# Patient Record
Sex: Female | Born: 1980
Health system: Southern US, Community
[De-identification: ages and names within clinical notes are randomized; demographics above are authoritative.]

## PROBLEM LIST (undated history)

## (undated) DIAGNOSIS — R011 Cardiac murmur, unspecified: Secondary | ICD-10-CM

## (undated) DIAGNOSIS — Z973 Presence of spectacles and contact lenses: Secondary | ICD-10-CM

## (undated) DIAGNOSIS — I10 Essential (primary) hypertension: Secondary | ICD-10-CM

## (undated) DIAGNOSIS — R200 Anesthesia of skin: Secondary | ICD-10-CM

## (undated) DIAGNOSIS — Z8669 Personal history of other diseases of the nervous system and sense organs: Secondary | ICD-10-CM

## (undated) DIAGNOSIS — K219 Gastro-esophageal reflux disease without esophagitis: Secondary | ICD-10-CM

## (undated) DIAGNOSIS — F4010 Social phobia, unspecified: Secondary | ICD-10-CM

## (undated) DIAGNOSIS — F909 Attention-deficit hyperactivity disorder, unspecified type: Secondary | ICD-10-CM

## (undated) DIAGNOSIS — E049 Nontoxic goiter, unspecified: Secondary | ICD-10-CM

## (undated) DIAGNOSIS — G43909 Migraine, unspecified, not intractable, without status migrainosus: Secondary | ICD-10-CM

## (undated) DIAGNOSIS — K649 Unspecified hemorrhoids: Secondary | ICD-10-CM

## (undated) DIAGNOSIS — Z8701 Personal history of pneumonia (recurrent): Secondary | ICD-10-CM

## (undated) DIAGNOSIS — E041 Nontoxic single thyroid nodule: Secondary | ICD-10-CM

## (undated) HISTORY — DX: Essential (primary) hypertension: I10

## (undated) HISTORY — PX: WISDOM TOOTH EXTRACTION: SHX21

## (undated) SURGERY — Surgical Case
Anesthesia: *Unknown

---

## 2001-02-26 ENCOUNTER — Other Ambulatory Visit: Admission: RE | Admit: 2001-02-26 | Discharge: 2001-02-26 | Payer: Self-pay | Admitting: Family Medicine

## 2001-07-26 ENCOUNTER — Emergency Department (HOSPITAL_COMMUNITY): Admission: EM | Admit: 2001-07-26 | Discharge: 2001-07-26 | Payer: Self-pay

## 2001-08-09 ENCOUNTER — Ambulatory Visit (HOSPITAL_COMMUNITY): Admission: RE | Admit: 2001-08-09 | Discharge: 2001-08-09 | Payer: Self-pay

## 2002-05-03 ENCOUNTER — Other Ambulatory Visit: Admission: RE | Admit: 2002-05-03 | Discharge: 2002-05-03 | Payer: Self-pay | Admitting: Family Medicine

## 2004-04-18 ENCOUNTER — Other Ambulatory Visit: Admission: RE | Admit: 2004-04-18 | Discharge: 2004-04-18 | Payer: Self-pay | Admitting: Family Medicine

## 2004-08-14 ENCOUNTER — Encounter (INDEPENDENT_AMBULATORY_CARE_PROVIDER_SITE_OTHER): Payer: Self-pay | Admitting: Cardiovascular Disease

## 2004-08-14 ENCOUNTER — Ambulatory Visit: Admission: RE | Admit: 2004-08-14 | Discharge: 2004-08-14 | Payer: Self-pay | Admitting: Family Medicine

## 2008-11-30 HISTORY — PX: BREAST ENHANCEMENT SURGERY: SHX7

## 2010-05-02 ENCOUNTER — Inpatient Hospital Stay (HOSPITAL_COMMUNITY)
Admission: AD | Admit: 2010-05-02 | Discharge: 2010-05-05 | Payer: Self-pay | Source: Home / Self Care | Admitting: Obstetrics and Gynecology

## 2010-05-03 ENCOUNTER — Encounter (INDEPENDENT_AMBULATORY_CARE_PROVIDER_SITE_OTHER): Payer: Self-pay | Admitting: Obstetrics and Gynecology

## 2010-05-11 ENCOUNTER — Inpatient Hospital Stay (HOSPITAL_COMMUNITY): Admission: AD | Admit: 2010-05-11 | Discharge: 2010-05-11 | Payer: Self-pay | Admitting: Obstetrics

## 2010-05-11 ENCOUNTER — Ambulatory Visit: Payer: Self-pay | Admitting: Physician Assistant

## 2010-08-30 ENCOUNTER — Inpatient Hospital Stay (HOSPITAL_COMMUNITY): Admission: AD | Admit: 2010-08-30 | Discharge: 2010-04-27 | Payer: Self-pay | Admitting: Obstetrics and Gynecology

## 2010-12-07 LAB — CBC
HCT: 33.5 % — ABNORMAL LOW (ref 36.0–46.0)
HCT: 40.3 % (ref 36.0–46.0)
HCT: 40.4 % (ref 36.0–46.0)
Hemoglobin: 13.8 g/dL (ref 12.0–15.0)
Hemoglobin: 13.8 g/dL (ref 12.0–15.0)
MCH: 33 pg (ref 26.0–34.0)
MCH: 33.1 pg (ref 26.0–34.0)
MCHC: 34 g/dL (ref 30.0–36.0)
MCHC: 34.1 g/dL (ref 30.0–36.0)
MCHC: 34.1 g/dL (ref 30.0–36.0)
MCV: 96.6 fL (ref 78.0–100.0)
MCV: 96.9 fL (ref 78.0–100.0)
MCV: 98.8 fL (ref 78.0–100.0)
Platelets: 173 10*3/uL (ref 150–400)
Platelets: 223 10*3/uL (ref 150–400)
Platelets: 224 10*3/uL (ref 150–400)
RBC: 4.16 MIL/uL (ref 3.87–5.11)
RBC: 4.18 MIL/uL (ref 3.87–5.11)
RDW: 13.9 % (ref 11.5–15.5)
RDW: 14 % (ref 11.5–15.5)
RDW: 14.5 % (ref 11.5–15.5)
WBC: 10.8 10*3/uL — ABNORMAL HIGH (ref 4.0–10.5)
WBC: 12.2 10*3/uL — ABNORMAL HIGH (ref 4.0–10.5)
WBC: 15.1 10*3/uL — ABNORMAL HIGH (ref 4.0–10.5)

## 2010-12-07 LAB — ABO/RH: ABO/RH(D): O NEG

## 2010-12-07 LAB — COMPREHENSIVE METABOLIC PANEL
AST: 20 U/L (ref 0–37)
Albumin: 3.1 g/dL — ABNORMAL LOW (ref 3.5–5.2)
Alkaline Phosphatase: 136 U/L — ABNORMAL HIGH (ref 39–117)
BUN: 3 mg/dL — ABNORMAL LOW (ref 6–23)
CO2: 23 mEq/L (ref 19–32)
Chloride: 105 mEq/L (ref 96–112)
Creatinine, Ser: 0.74 mg/dL (ref 0.4–1.2)
GFR calc non Af Amer: >60 mL/min (ref >60)
Potassium: 3.6 mEq/L (ref 3.5–5.1)
Total Bilirubin: 0.6 mg/dL (ref 0.3–1.2)

## 2010-12-07 LAB — URINE CULTURE
Colony Count: >=100000
Culture  Setup Time: 201108111537
Special Requests: POSITIVE

## 2010-12-07 LAB — RPR: RPR Ser Ql: NONREACTIVE

## 2010-12-07 LAB — RH IMMUNE GLOB WKUP(>/=20WKS)(NOT WOMEN'S HOSP)

## 2010-12-07 LAB — URIC ACID: Uric Acid, Serum: 5.6 mg/dL (ref 2.4–7.0)

## 2011-01-29 ENCOUNTER — Other Ambulatory Visit: Payer: Self-pay | Admitting: Obstetrics and Gynecology

## 2011-09-24 HISTORY — PX: HEMORRHOID SURGERY: SHX153

## 2012-08-28 ENCOUNTER — Emergency Department: Payer: Self-pay | Admitting: Emergency Medicine

## 2014-01-31 ENCOUNTER — Ambulatory Visit (INDEPENDENT_AMBULATORY_CARE_PROVIDER_SITE_OTHER): Payer: Managed Care, Other (non HMO) | Admitting: Surgery

## 2014-01-31 ENCOUNTER — Encounter (INDEPENDENT_AMBULATORY_CARE_PROVIDER_SITE_OTHER): Payer: Self-pay | Admitting: Surgery

## 2014-01-31 VITALS — BP 120/80 | HR 80 | Temp 97.2°F | Resp 12 | Ht 65.0 in | Wt 154.4 lb

## 2014-01-31 DIAGNOSIS — K5900 Constipation, unspecified: Secondary | ICD-10-CM

## 2014-01-31 DIAGNOSIS — K5909 Other constipation: Secondary | ICD-10-CM

## 2014-01-31 DIAGNOSIS — K644 Residual hemorrhoidal skin tags: Secondary | ICD-10-CM

## 2014-01-31 DIAGNOSIS — K648 Other hemorrhoids: Secondary | ICD-10-CM

## 2014-01-31 NOTE — Progress Notes (Signed)
Subjective:     Patient ID: Evelyn Lawson, female   DOB: 17-Mar-1981, 33 y.o.   MRN: [id number]  HPI  Note: This dictation was prepared with Dragon/digital dictation along with [facility] technology. Any transcriptional errors that result from this process are unintentional.       Evelyn Lawson  [birth year] [id number]  Patient Care Team: Lucianne Lei, MD as PCP - General (Family Medicine) Marvene Staff, MD as Consulting Physician (Obstetrics and Gynecology)  This patient is a 33 y.o.female who presents today for surgical evaluation at the request of Dr. Garwin Brothers.   Reason for visit: External hemorrhoids with irritation and pain.  History of internal hemorrhoids.  Pleasant young female.  Times a day with herself.  Started having more problems when she was pregnant a few years ago.  Had issues with bleeding and pain.  That has calmed down.  However she noticed hemorrhoids on the outside.  It is hard to keep the skin clean.  They can be irritating.  She struggles with constipation with a bowel movement usually twice a week.  No persistent bleeding or prolapse.  No pain with defecation  No personal nor family history of GI/colon cancer, inflammatory bowel disease, irritable bowel syndrome, allergy such as Celiac Sprue, dietary/dairy problems, colitis, ulcers nor gastritis.  No recent sick contacts/gastroenteritis.  No travel outside the country.  No changes in diet.  No dysphagia to solids or liquids.  No significant heartburn or reflux.  No hematochezia, hematemesis, coffee ground emesis.  No evidence of prior gastric/peptic ulceration.    There are no active problems to display for this patient.   Past Medical History  Diagnosis Date  . Hypertension     Past Surgical History  Procedure Laterality Date  . Breast enhancement surgery  11/30/2008    History   Social History  . Marital Status: Single    Spouse Name: N/A    Number of Children: N/A  . Years of Education: N/A    Occupational History  . Not on file.   Social History Main Topics  . Smoking status: Never Smoker   . Smokeless tobacco: Never Used  . Alcohol Use: 1.2 oz/week    1 Glasses of wine, 1 Cans of beer per week     Comment: occ  . Drug Use: No  . Sexual Activity: Not on file   Other Topics Concern  . Not on file   Social History Narrative  . No narrative on file    Family History  Problem Relation Age of Onset  . Cancer Maternal Grandmother     breast  . Cancer Paternal Grandmother     breast  . Cancer Paternal Grandfather     colon  . Diabetes Maternal Grandfather     Current Outpatient Prescriptions  Medication Sig Dispense Refill  . nebivolol (BYSTOLIC) 5 MG tablet Take 5 mg by mouth daily.       No current facility-administered medications for this visit.     No Known Allergies  BP 120/80  Pulse 80  Temp(Src) 97.2 F (36.2 C)  Resp 12  Ht 5\' 5"  (1.651 m)  Wt 154 lb 6.4 oz (70.035 kg)  BMI 25.69 kg/m2  No results found.   Review of Systems  Constitutional: Negative for fever, chills, diaphoresis, appetite change and fatigue.  HENT: Negative for ear discharge, ear pain, sore throat and trouble swallowing.   Eyes: Negative for photophobia, discharge and visual disturbance.  Respiratory: Negative for cough, choking,  chest tightness and shortness of breath.   Cardiovascular: Negative for chest pain and palpitations.  Gastrointestinal: Positive for anal bleeding and rectal pain. Negative for nausea, vomiting, abdominal pain, diarrhea and constipation.  Endocrine: Negative for cold intolerance and heat intolerance.  Genitourinary: Negative for dysuria, frequency and difficulty urinating.  Musculoskeletal: Negative for gait problem, myalgias and neck pain.  Skin: Negative for color change, pallor and rash.  Allergic/Immunologic: Negative for environmental allergies, food allergies and immunocompromised state.  Neurological: Negative for dizziness, speech  difficulty, weakness and numbness.  Hematological: Negative for adenopathy.  Psychiatric/Behavioral: Negative for confusion and agitation. The patient is not nervous/anxious.        Objective:   Physical Exam  Constitutional: She is oriented to person, place, and time. She appears well-developed and well-nourished. No distress.  HENT:  Head: Normocephalic.  Mouth/Throat: Oropharynx is clear and moist. No oropharyngeal exudate.  Eyes: Conjunctivae and EOM are normal. Pupils are equal, round, and reactive to light. No scleral icterus.  Neck: Normal range of motion. Neck supple. No tracheal deviation present.  Cardiovascular: Normal rate, regular rhythm and intact distal pulses.   Pulmonary/Chest: Effort normal and breath sounds normal. No stridor. No respiratory distress. She exhibits no tenderness.  Abdominal: Soft. She exhibits no distension and no mass. There is no tenderness. Hernia confirmed negative in the right inguinal area and confirmed negative in the left inguinal area.  Genitourinary: Rectal exam shows external hemorrhoid. Rectal exam shows no fissure and anal tone normal.    No vaginal discharge found.  Exam done with assistance of female Medical Assistant in the room.  Perianal skin clean with good hygiene.  No pruritis.  No pilonidal disease.  No fissure.  No abscess/fistula.  Normal sphincter tone.  Tolerates digital and anoscopic rectal exam.  No rectal masses.  External hemorrhoids R post>Rant..    Hemorrhoidal piles internally mildly enlarged  Musculoskeletal: Normal range of motion. She exhibits no tenderness.       Right elbow: She exhibits normal range of motion.       Left elbow: She exhibits normal range of motion.       Right wrist: She exhibits normal range of motion.       Left wrist: She exhibits normal range of motion.       Right hand: Normal strength noted.       Left hand: Normal strength noted.  Lymphadenopathy:       Head (right side): No posterior  auricular adenopathy present.       Head (left side): No posterior auricular adenopathy present.    She has no cervical adenopathy.    She has no axillary adenopathy.       Right: No inguinal adenopathy present.       Left: No inguinal adenopathy present.  Neurological: She is alert and oriented to person, place, and time. No cranial nerve deficit. She exhibits normal muscle tone. Coordination normal.  Skin: Skin is warm and dry. No rash noted. She is not diaphoretic. No erythema.  Psychiatric: She has a normal mood and affect. Her behavior is normal. Judgment and thought content normal.       Assessment:     External hemorrhoids with irritation and occasional pain.  Internal hemorrhoids.  Chronic constipation.     Plan:     I think she would benefit from surgery to remove the external hemorrhoids.  Too large & sensitive to do in office.  This allowed me to see if she  may need internal hemorrhoid ligation or removal as well.  Not to point of needing [place].  She is interested in proceeding:  The anatomy & physiology of the anorectal region was discussed.  The pathophysiology of hemorrhoids and differential diagnosis was discussed.  Natural history risks without surgery was discussed.   I stressed the importance of a bowel regimen to have daily soft bowel movements to minimize progression of disease.  Interventions such as sclerotherapy & banding were discussed.  The patient's symptoms are not adequately controlled by medicines and other non-operative treatments.  I feel the risks & problems of no surgery outweigh the operative risks; therefore, I recommended surgery to treat the hemorrhoids by ligation, pexy, and possible resection.  Risks such as bleeding, infection, urinary difficulties, need for further treatment, heart attack, death, and other risks were discussed.   I noted a good likelihood this will help address the problem.  Goals of post-operative recovery were discussed as well.   Possibility that this will not correct all symptoms was explained.  Post-operative pain, bleeding, constipation, and other problems after surgery were discussed.  We will work to minimize complications.   Educational handouts further explaining the pathology, treatment options, and bowel regimen were given as well.  Questions were answered.  The patient expresses understanding & wishes to proceed with surgery.

## 2014-01-31 NOTE — Patient Instructions (Signed)
Please consider the recommendations that we have given you today:  Consider surgical examination under anesthesia with removal of the external and possible internal hemorrhoids  See the Handout(s) we have given you.  Please call our office at [phone] if you wish to schedule surgery or if you have further questions / concerns.   HEMORRHOIDS  The rectum is the last foot of your colon, and it naturally stretches to hold stool.  Hemorrhoidal piles are natural clusters of blood vessels that help the rectum and anal canal stretch to hold stool and allow bowel movements to eliminate feces.   Hemorrhoids are abnormally swollen blood vessels in the rectum.  Too much pressure in the rectum causes hemorrhoids by forcing blood to stretch and bulge the walls of the veins, sometimes even rupturing them.  Hemorrhoids can become like varicose veins you might see on a person's legs.  Most people will develop a flare of hemorrhoids in their lifetime.  When bulging hemorrhoidal veins are irritated, they can swell, burn, itch, cause pain, and bleed.  Most flares will calm down gradually own within a few weeks.  However, once hemorrhoids are created, they are difficult to get rid of completely and tend to flare more easily than the first flare.   Fortunately, good habits and simple medical treatment usually control hemorrhoids well, and surgery is needed only in severe cases. Types of Hemorrhoids:  Internal hemorrhoids usually don't initially hurt or itch; they are deep inside the rectum and usually have no sensation. If they begin to push out (prolapse), pain and burning can occur.  However, internal hemorrhoids can bleed.  Anal bleeding should not be ignored since bleeding could come from a dangerous source like colorectal cancer, so persistent rectal bleeding should be investigated by a doctor, sometimes with a colonoscopy.  External hemorrhoids cause most of the symptoms - pain, burning, and itching.  Nonirritated hemorrhoids can look like small skin tags coming out of the anus.   Thrombosed hemorrhoids can form when a hemorrhoid blood vessel bursts and causes the hemorrhoid to suddenly swell.  A purple blood clot can form in it and become an excruciatingly painful lump at the anus. Because of these unpleasant symptoms, immediate incision and drainage by a surgeon at an office visit can provide much relief of the pain.    PREVENTION Avoiding the most frequent causes listed below will prevent most cases of hemorrhoids: Constipation Hard stools Diarrhea  Constant sitting  Straining with bowel movements Sitting on the toilet for a long time  Severe coughing  episodes Pregnancy / Childbirth  Heavy Lifting  Sometimes avoiding the above triggers is difficult:  How can you avoid sitting all day if you have a seated job? Also, we try to avoid coughing and diarrhea, but sometimes it's beyond your control.  Still, there are some practical hints to help: Keep the anal and genital area clean.  Moistened tissues such as flushable wet wipes are less irritating than toilet paper.  Using irrigating showers or bottle irrigation washing gently cleans this sensitive area.   Avoid dry toilet paper when cleaning after bowel movements.  Marland Kitchen Keep the anal and genital area dry.  Lightly pat the rectal area dry.  Avoid rubbing.  Talcum or baby powders can help GET YOUR STOOLS SOFT.   This is the most important way to prevent irritated hemorrhoids.  Hard stools are like sandpaper to the anorectal canal and will cause more problems.  The goal: ONE SOFT BOWEL MOVEMENT A  DAY!  BMs from every other day to 3 times a day is a tolerable range Treat coughing, diarrhea and constipation early since irritated hemorrhoids may soon follow.  If your main job activity is seated, always stand or walk during your breaks. Make it a point to stand and walk at least 5 minutes every hour and try to shift frequently in your chair to avoid  direct rectal pressure.  Always exhale as you strain or lift. Don't hold your breath.  Do not delay or try to prevent a bowel movement when the urge is present. Exercise regularly (walking or jogging 60 minutes a day) to stimulate the bowels to move. No reading or other activity while on the toilet. If bowel movements take longer than 5 minutes, you are too constipated. AVOID CONSTIPATION Drink plenty of liquids (1 1/2 to 2 quarts of water and other fluids a day unless fluid restricted for another medical condition). Liquids that contain caffeine (coffee a, tea, soft drinks) can be dehydrating and should be avoided until constipation is controlled. Consider minimizing milk, as dairy products may be constipating. Eat plenty of fiber (30g a day ideal, more if needed).  Fiber is the undigested part of plant food that passes into the colon, acting as "natures broom" to encourage bowel motility and movement.  Fiber can absorb and hold large amounts of water. This results in a larger, bulkier stool, which is soft and easier to pass.  Eating foods high in fiber - 12 servings - such as  Vegetables: Root (potatoes, carrots, turnips), Leafy green (lettuce, salad greens, celery, spinach), High residue (cabbage, broccoli, etc.) Fruit: Fresh, Dried (prunes, apricots, cherries), Stewed (applesauce)  Whole grain breads, pasta, whole wheat Bran cereals, muffins, etc. Consider adding supplemental bulking fiber which retains large volumes of water: Psyllium ground seeds (native plant from [place])--available as Metamucil, Konsyl, Effersyllium, Per Diem Fiber, or the less expensive generic forms.  Citrucel  (methylcellulose wood fiber) . FiberCon (Polycarbophil) Polyethylene Glycol - and "artificial" fiber commonly called Miralax or Glycolax.  It is helpful for people with gassy or bloated feelings with regular fiber Flax Seed - a less gassy natural fiber  Laxatives can be useful for a short period if  constipation is severe Osmotics (Milk of Magnesia, Fleets Phospho-Soda, Magnesium Citrate)  Stimulants (Senokot,   Castor Oil,  Dulcolax, Ex-Lax)    Laxatives are not a good long-term solution as it can stress the bowels and cause too much mineral loss and dehydration.   Avoid taking laxatives for more than 7 days in a row.  AVOID DIARRHEA Switch to liquids and simpler foods for a few days to avoid stressing your intestines further. Avoid dairy products (especially milk & ice cream) for a short time.  The intestines often can lose the ability to digest lactose when stressed. Avoid foods that cause gassiness or bloating.  Typical foods include beans and other legumes, cabbage, broccoli, and dairy foods.  Every person has some sensitivity to other foods, so listen to your body and avoid those foods that trigger problems for you. Adding fiber (Citrucel, Metamucil, FiberCon, Flax seed, Miralax) gradually can help thicken stools by absorbing excess fluid and retrain the intestines to act more normally.  Slowly increase the dose over a few weeks.  Too much fiber too soon can backfire and cause cramping & bloating. Probiotics (such as active yogurt, Align, etc) may help repopulate the intestines and colon with normal bacteria and calm down a sensitive digestive tract.  Most  studies show it to be of mild help, though, and such products can be costly. Medicines: Bismuth subsalicylate (ex. Kayopectate, Pepto Bismol) every 30 minutes for up to 6 doses can help control diarrhea.  Avoid if pregnant. Loperamide (Immodium) can slow down diarrhea.  Start with two tablets (4mg  total) first and then try one tablet every 6 hours.  Avoid if you are having fevers or severe pain.  If you are not better or start feeling worse, stop all medicines and call your doctor for advice Call your doctor if you are getting worse or not better.  Sometimes further testing (cultures, endoscopy, X-ray studies, bloodwork, etc) may be needed  to help diagnose and treat the cause of the diarrhea. TREATMENT OF HEMORRHOID FLARE If these preventive measures fail, you must take action right away! Hemorrhoids are one condition that can be mild in the morning and become intolerable by nightfall. Most hemorrhoidal flares take several weeks to calm down.  These suggestions can help: Warm soaks.  This helps more than any topical medication.  Use up to 8 times a day.  Usually sitz baths or sitting in a warm bathtub helps.  Sitting on moist warm towels are helpful.  Switching to ice packs/cool compresses can be helpful  Use a Sitz Bath 4-8 times a day for relief A sitz bath is a warm water bath taken in the sitting position that covers only the hips and buttocks. It may be used for either healing or hygiene purposes. Sitz baths are also used to relieve pain, itching, or muscle spasms. The water may contain medicine. Moist heat will help you heal and relax.  HOME CARE INSTRUCTIONS  Take 3 to 4 sitz baths a day. 1. Fill the bathtub half full with warm water. 2. Sit in the water and open the drain a little. 3. Turn on the warm water to keep the tub half full. Keep the water running constantly. 4. Soak in the water for 15 to 20 minutes. 5. After the sitz bath, pat the affected area dry first. SEEK MEDICAL CARE IF:  You get worse instead of better. Stop the sitz baths if you get worse.  Normalize your bowels.  Extremes of diarrhea or constipation will make hemorrhoids worse.  One soft bowel movement a day is the goal.  Fiber can help get your bowels regular Wet wipes instead of toilet paper Pain control with a NSAID such as ibuprofen (Advil) or naproxen (Aleve) or acetaminophen (Tylenol) around the clock.  Narcotics are constipating and should be minimized if possible Topical creams contain steroids (bydrocortisone) or local anesthetic (xylocaine) can help make pain and itching more tolerable.   EVALUATION If hemorrhoids are still causing problems,  you could benefit by an evaluation by a surgeon.  The surgeon will obtain a history and examine you.  If hemorrhoids are diagnosed, some therapies can be offered in the office, usually with an anoscope into the less sensitive area of the rectum: -injection of hemorrhoids (sclerotherapy) can scar the blood vessels of the swollen/enlarged hemorrhoids to help shrink them down to a more normal size -rubber banding of the enlarged hemorrhoids to help shrink them down to a more normal size -drainage of the blood clot causing a thrombosed hemorrhoid,  to relieve the severe pain   While 90% of the time such problems from hemorrhoids can be managed without preceding to surgery, sometimes the hemorrhoids require a operation to control the problem (uncontrolled bleeding, prolapse, pain, etc.).   This involves being  placed under general anesthesia where the surgeon can confirm the diagnosis and remove, suture, or staple the hemorrhoid(s).  Your surgeon can help you treat the problem appropriately.    ANORECTAL SURGERY: POST OP INSTRUCTIONS  1. Take your usually prescribed home medications unless otherwise directed. 2. DIET: Follow a light bland diet the first 24 hours after arrival home, such as soup, liquids, crackers, etc.  Be sure to include lots of fluids daily.  Avoid fast food or heavy meals as your are more likely to get nauseated.  Eat a low fat the next few days after surgery.   3. PAIN CONTROL: a. Pain is best controlled by a usual combination of three different methods TOGETHER: i. Ice/Heat ii. Over the counter pain medication iii. Prescription pain medication b. Most patients will experience some swelling and discomfort in the anus/rectal area. and incisions.  Ice packs or heat (30-60 minutes up to 6 times a day) will help. Use ice for the first few days to help decrease swelling and bruising, then switch to heat such as warm towels, sitz baths, warm baths, etc to help relax tight/sore spots and  speed recovery.  Some people prefer to use ice alone, heat alone, alternating between ice & heat.  Experiment to what works for you.  Swelling and bruising can take several weeks to resolve.   c. It is helpful to take an over-the-counter pain medication regularly for the first few weeks.  Choose one of the following that works best for you: i. Naproxen (Aleve, etc)  Two 220mg  tabs twice a day ii. Ibuprofen (Advil, etc) Three 200mg  tabs four times a day (every meal & bedtime) iii. Acetaminophen (Tylenol, etc) 500-650mg  four times a day (every meal & bedtime) d. A  prescription for pain medication (such as oxycodone, hydrocodone, etc) should be given to you upon discharge.  Take your pain medication as prescribed.  i. If you are having problems/concerns with the prescription medicine (does not control pain, nausea, vomiting, rash, itching, etc), please call us [phone] to see if we need to switch you to a different pain medicine that will work better for you and/or control your side effect better. ii. If you need a refill on your pain medication, please contact your pharmacy.  They will contact our office to request authorization. Prescriptions will not be filled after 5 pm or on week-ends.  Use a Sitz Bath 4-8 times a day for relief A sitz bath is a warm water bath taken in the sitting position that covers only the hips and buttocks. It may be used for either healing or hygiene purposes. Sitz baths are also used to relieve pain, itching, or muscle spasms. The water may contain medicine. Moist heat will help you heal and relax.  HOME CARE INSTRUCTIONS  Take 3 to 4 sitz baths a day. 6. Fill the bathtub half full with warm water. 7. Sit in the water and open the drain a little. 8. Turn on the warm water to keep the tub half full. Keep the water running constantly. 9. Soak in the water for 15 to 20 minutes. 10. After the sitz bath, pat the affected area dry first. SEEK MEDICAL CARE IF:  You get  worse instead of better. Stop the sitz baths if you get worse.   4. KEEP YOUR BOWELS REGULAR a. The goal is one bowel movement a day b. Avoid getting constipated.  Between the surgery and the pain medications, it is common to experience some constipation.  Increasing fluid intake and taking a fiber supplement (such as Metamucil, Citrucel, FiberCon, MiraLax, etc) 1-2 times a day regularly will usually help prevent this problem from occurring.  A mild laxative (prune juice, Milk of Magnesia, MiraLax, etc) should be taken according to package directions if there are no bowel movements after 48 hours. c. Watch out for diarrhea.  If you have many loose bowel movements, simplify your diet to bland foods & liquids for a few days.  Stop any stool softeners and decrease your fiber supplement.  Switching to mild anti-diarrheal medications (Kayopectate, Pepto Bismol) can help.  If this worsens or does not improve, please call us.  5. Wound Care a. Remove your bandages the day after surgery.  Unless discharge instructions indicate otherwise, leave your bandage dry and in place overnight.  Remove the bandage during your first bowel movement.   b. Allow the wound packing to fall out over the next few days.  You can trim exposed gauze / ribbon as it falls out.  You do not need to repack the wound unless instructed otherwise.  Wear an absorbent pad or soft cotton gauze in your underwear as needed to catch any drainage and help keep the area  c. Keep the area clean and dry.  Bathe / shower every day.  Keep the area clean by showering / bathing over the incision / wound.   It is okay to soak an open wound to help wash it.  Wet wipes or showers / gentle washing after bowel movements is often less traumatic than regular toilet paper. d. Dennis Bast may have some styrofoam-like soft packing in the rectum which will come out with the first bowel movement.  e. You will often notice bleeding with bowel movements.  This should slow  down by the end of the first week of surgery f. Expect some drainage.  This should slow down, too, by the end of the first week of surgery.  Wear an absorbent pad or soft cotton gauze in your underwear until the drainage stops. 6. ACTIVITIES as tolerated:   a. You may resume regular (light) daily activities beginning the next day-such as daily self-care, walking, climbing stairs-gradually increasing activities as tolerated.  If you can walk 30 minutes without difficulty, it is safe to try more intense activity such as jogging, treadmill, bicycling, low-impact aerobics, swimming, etc. b. Save the most intensive and strenuous activity for last such as sit-ups, heavy lifting, contact sports, etc  Refrain from any heavy lifting or straining until you are off narcotics for pain control.   c. DO NOT PUSH THROUGH PAIN.  Let pain be your guide: If it hurts to do something, don't do it.  Pain is your body warning you to avoid that activity for another week until the pain goes down. d. You may drive when you are no longer taking prescription pain medication, you can comfortably sit for long periods of time, and you can safely maneuver your car and apply brakes. e. Dennis Bast may have sexual intercourse when it is comfortable.  7. FOLLOW UP in our office a. Please call [facility] at [phone] to set up an appointment to see your surgeon in the office for a follow-up appointment approximately 2 weeks after your surgery. b. Make sure that you call for this appointment the day you arrive home to insure a convenient appointment time. 10. IF YOU HAVE DISABILITY OR FAMILY LEAVE FORMS, BRING THEM TO THE OFFICE FOR PROCESSING.  DO NOT GIVE THEM TO  YOUR DOCTOR.        WHEN TO CALL us [phone]: 1. Poor pain control 2. Reactions / problems with new medications (rash/itching, nausea, etc)  3. Fever over 101.5 F (38.5 C) 4. Inability to urinate 5. Nausea and/or vomiting 6. Worsening swelling or bruising 7. Continued  bleeding from incision. 8. Increased pain, redness, or drainage from the incision  The clinic staff is available to answer your questions during regular business hours (8:30am-5pm).  Please don't hesitate to call and ask to speak to one of our nurses for clinical concerns.   A surgeon from [facility] is always on call at the hospitals   If you have a medical emergency, go to the nearest emergency room or call 911.    [facility], [address], [place], [place], [place]  [address] ? MAIN: [phone] ? TOLL FREE: [phone] ? FAX [phone] [email or web address]   GETTING TO GOOD BOWEL HEALTH. Irregular bowel habits such as constipation and diarrhea can lead to many problems over time.  Having one soft bowel movement a day is the most important way to prevent further problems.  The anorectal canal is designed to handle stretching and feces to safely manage our ability to get rid of solid waste (feces, poop, stool) out of our body.  BUT, hard constipated stools can act like ripping concrete bricks and diarrhea can be a burning fire to this very sensitive area of our body, causing inflamed hemorrhoids, anal fissures, increasing risk is perirectal abscesses, abdominal pain/bloating, an making irritable bowel worse.     The goal: ONE SOFT BOWEL MOVEMENT A DAY!  To have soft, regular bowel movements:    Drink at least 8 tall glasses of water a day.     Take plenty of fiber.  Fiber is the undigested part of plant food that passes into the colon, acting s "natures broom" to encourage bowel motility and movement.  Fiber can absorb and hold large amounts of water. This results in a larger, bulkier stool, which is soft and easier to pass. Work gradually over several weeks up to 6 servings a day of fiber (25g a day even more if needed) in the form of: o Vegetables -- Root (potatoes, carrots, turnips), leafy green (lettuce, salad greens, celery,  spinach), or cooked high residue (cabbage, broccoli, etc) o Fruit -- Fresh (unpeeled skin & pulp), Dried (prunes, apricots, cherries, etc ),  or stewed ( applesauce)  o Whole grain breads, pasta, etc (whole wheat)  o Bran cereals    Bulking Agents -- This type of water-retaining fiber generally is easily obtained each day by one of the following:  o Psyllium bran -- The psyllium plant is remarkable because its ground seeds can retain so much water. This product is available as Metamucil, Konsyl, Effersyllium, Per Diem Fiber, or the less expensive generic preparation in drug and health food stores. Although labeled a laxative, it really is not a laxative.  o Methylcellulose -- This is another fiber derived from wood which also retains water. It is available as Citrucel. o Polyethylene Glycol - and "artificial" fiber commonly called Miralax or Glycolax.  It is helpful for people with gassy or bloated feelings with regular fiber o Flax Seed - a less gassy fiber than psyllium   No reading or other relaxing activity while on the toilet. If bowel movements take longer than 5 minutes, you are too constipated   AVOID CONSTIPATION.  High fiber and water  intake usually takes care of this.  Sometimes a laxative is needed to stimulate more frequent bowel movements, but    Laxatives are not a good long-term solution as it can wear the colon out. o Osmotics (Milk of Magnesia, Fleets phosphosoda, Magnesium citrate, MiraLax, GoLytely) are safer than  o Stimulants (Senokot, Castor Oil, Dulcolax, Ex Lax)    o Do not take laxatives for more than 7days in a row.    IF SEVERELY CONSTIPATED, try a Bowel Retraining Program: o Do not use laxatives.  o Eat a diet high in roughage, such as bran cereals and leafy vegetables.  o Drink six (6) ounces of prune or apricot juice each morning.  o Eat two (2) large servings of stewed fruit each day.  o Take one (1) heaping tablespoon of a psyllium-based bulking agent twice a day.  Use sugar-free sweetener when possible to avoid excessive calories.  o Eat a normal breakfast.  o Set aside 15 minutes after breakfast to sit on the toilet, but do not strain to have a bowel movement.  o If you do not have a bowel movement by the third day, use an enema and repeat the above steps.    Controlling diarrhea o Switch to liquids and simpler foods for a few days to avoid stressing your intestines further. o Avoid dairy products (especially milk & ice cream) for a short time.  The intestines often can lose the ability to digest lactose when stressed. o Avoid foods that cause gassiness or bloating.  Typical foods include beans and other legumes, cabbage, broccoli, and dairy foods.  Every person has some sensitivity to other foods, so listen to our body and avoid those foods that trigger problems for you. o Adding fiber (Citrucel, Metamucil, psyllium, Miralax) gradually can help thicken stools by absorbing excess fluid and retrain the intestines to act more normally.  Slowly increase the dose over a few weeks.  Too much fiber too soon can backfire and cause cramping & bloating. o Probiotics (such as active yogurt, Align, etc) may help repopulate the intestines and colon with normal bacteria and calm down a sensitive digestive tract.  Most studies show it to be of mild help, though, and such products can be costly. o Medicines:   Bismuth subsalicylate (ex. Kayopectate, Pepto Bismol) every 30 minutes for up to 6 doses can help control diarrhea.  Avoid if pregnant.   Loperamide (Immodium) can slow down diarrhea.  Start with two tablets (4mg  total) first and then try one tablet every 6 hours.  Avoid if you are having fevers or severe pain.  If you are not better or start feeling worse, stop all medicines and call your doctor for advice o Call your doctor if you are getting worse or not better.  Sometimes further testing (cultures, endoscopy, X-ray studies, bloodwork, etc) may be needed to help  diagnose and treat the cause of the diarrhea. o

## 2014-07-21 ENCOUNTER — Other Ambulatory Visit (INDEPENDENT_AMBULATORY_CARE_PROVIDER_SITE_OTHER): Payer: Self-pay | Admitting: Surgery

## 2015-12-18 ENCOUNTER — Emergency Department (HOSPITAL_COMMUNITY): Payer: Managed Care, Other (non HMO)

## 2015-12-18 ENCOUNTER — Encounter (HOSPITAL_COMMUNITY): Payer: Self-pay

## 2015-12-18 ENCOUNTER — Emergency Department (HOSPITAL_COMMUNITY)
Admission: EM | Admit: 2015-12-18 | Discharge: 2015-12-18 | Disposition: A | Discharge status: Home / Self Care | Payer: Managed Care, Other (non HMO) | Attending: Emergency Medicine | Admitting: Emergency Medicine

## 2015-12-18 DIAGNOSIS — Z8659 Personal history of other mental and behavioral disorders: Secondary | ICD-10-CM | POA: Diagnosis not present

## 2015-12-18 DIAGNOSIS — Z3202 Encounter for pregnancy test, result negative: Secondary | ICD-10-CM | POA: Diagnosis not present

## 2015-12-18 DIAGNOSIS — I10 Essential (primary) hypertension: Secondary | ICD-10-CM | POA: Insufficient documentation

## 2015-12-18 DIAGNOSIS — B349 Viral infection, unspecified: Secondary | ICD-10-CM | POA: Insufficient documentation

## 2015-12-18 DIAGNOSIS — Z79899 Other long term (current) drug therapy: Secondary | ICD-10-CM | POA: Insufficient documentation

## 2015-12-18 DIAGNOSIS — R55 Syncope and collapse: Secondary | ICD-10-CM | POA: Diagnosis present

## 2015-12-18 DIAGNOSIS — R079 Chest pain, unspecified: Secondary | ICD-10-CM | POA: Insufficient documentation

## 2015-12-18 HISTORY — DX: Attention-deficit hyperactivity disorder, unspecified type: F90.9

## 2015-12-18 HISTORY — DX: Social phobia, unspecified: F40.10

## 2015-12-18 LAB — URINE MICROSCOPIC-ADD ON

## 2015-12-18 LAB — BASIC METABOLIC PANEL
ANION GAP: 9 (ref 5–15)
BUN: 8 mg/dL (ref 6–20)
CHLORIDE: 99 mmol/L — AB (ref 101–111)
CO2: 29 mmol/L (ref 22–32)
Calcium: 8.8 mg/dL — ABNORMAL LOW (ref 8.9–10.3)
Creatinine, Ser: 0.97 mg/dL (ref 0.44–1.00)
GFR calc non Af Amer: >60 mL/min (ref >60)
GLUCOSE: 100 mg/dL — AB (ref 65–99)
Potassium: 3.5 mmol/L (ref 3.5–5.1)
Sodium: 137 mmol/L (ref 135–145)

## 2015-12-18 LAB — CBC
HEMATOCRIT: 39.9 % (ref 36.0–46.0)
HEMOGLOBIN: 13.2 g/dL (ref 12.0–15.0)
MCH: 31.4 pg (ref 26.0–34.0)
MCHC: 33.1 g/dL (ref 30.0–36.0)
MCV: 94.8 fL (ref 78.0–100.0)
Platelets: 159 10*3/uL (ref 150–400)
RBC: 4.21 MIL/uL (ref 3.87–5.11)
RDW: 12.7 % (ref 11.5–15.5)
WBC: 4.5 10*3/uL (ref 4.0–10.5)

## 2015-12-18 LAB — I-STAT BETA HCG BLOOD, ED (MC, WL, AP ONLY)

## 2015-12-18 LAB — URINALYSIS, ROUTINE W REFLEX MICROSCOPIC
BILIRUBIN URINE: NEGATIVE
GLUCOSE, UA: NEGATIVE mg/dL
Ketones, ur: NEGATIVE mg/dL
Leukocytes, UA: NEGATIVE
Nitrite: NEGATIVE
PH: 7 (ref 5.0–8.0)
Protein, ur: NEGATIVE mg/dL
SPECIFIC GRAVITY, URINE: 1.01 (ref 1.005–1.030)

## 2015-12-18 LAB — HEPATIC FUNCTION PANEL
ALT: 17 U/L (ref 14–54)
AST: 24 U/L (ref 15–41)
Albumin: 4.4 g/dL (ref 3.5–5.0)
Alkaline Phosphatase: 39 U/L (ref 38–126)
Total Bilirubin: 0.5 mg/dL (ref 0.3–1.2)
Total Protein: 7.8 g/dL (ref 6.5–8.1)

## 2015-12-18 LAB — CBG MONITORING, ED: Glucose-Capillary: 100 mg/dL — ABNORMAL HIGH (ref 65–99)

## 2015-12-18 MED ORDER — SODIUM CHLORIDE 0.9 % IV BOLUS (SEPSIS)
1000.0000 mL | Freq: Once | INTRAVENOUS | Status: AC
  Administered 2015-12-18: 1000 mL via INTRAVENOUS

## 2015-12-18 MED ORDER — ONDANSETRON 4 MG PO TBDP
4.0000 mg | ORAL_TABLET | Freq: Once | ORAL | Status: AC | PRN
  Administered 2015-12-18: 4 mg via ORAL
  Filled 2015-12-18: qty 1

## 2015-12-18 NOTE — ED Notes (Signed)
Bed: WA19 Expected date:  Expected time:  Means of arrival:  Comments: 

## 2015-12-18 NOTE — Discharge Instructions (Signed)
Plenty of fluids take Tylenol for fever and rest for 2 days

## 2015-12-18 NOTE — ED Notes (Signed)
Pt c/o several episodes of syncope, cough, nasal congestion, nausea, and L ribcage pain x 2 days.  Pain score 4/10.  Pt reports taking OTC medications w/o relief.  Pt sts her daughter was recently diagnosed w/ the flu.  Sts she has been trying to stay hydrated.

## 2015-12-19 NOTE — ED Provider Notes (Signed)
CSN: [id number]     Arrival date & time 12/18/15  1011 History   First MD Initiated Contact with Patient 12/18/15 1113     Chief Complaint  Patient presents with  . Loss of Consciousness  . Cough  . Chest Pain     (Consider location/radiation/quality/duration/timing/severity/associated sxs/prior Treatment) Patient is a 35 y.o. female presenting with syncope, cough, and chest pain. The history is provided by the patient (Patient states that she's had a cough weakness for couple days and has had 2 near syncopal episodes where she gets dizzy and sits down but does not blackout. Her daughter has the flu).  Loss of Consciousness Episode history: 2 episodes but not complete loss of consciousness. Most recent episode:  Today Timing:  Intermittent Progression:  Waxing and waning Chronicity:  New Context: not blood draw   Associated symptoms: chest pain and fever   Associated symptoms: no headaches and no seizures   Cough Associated symptoms: chest pain and fever   Associated symptoms: no eye discharge, no headaches and no rash   Chest Pain Associated symptoms: cough, fatigue, fever and syncope   Associated symptoms: no abdominal pain, no back pain and no headache     Past Medical History  Diagnosis Date  . Hypertension   . ADHD (attention deficit hyperactivity disorder)   . Social anxiety disorder    Past Surgical History  Procedure Laterality Date  . Breast enhancement surgery  11/30/2008   Family History  Problem Relation Age of Onset  . Cancer Maternal Grandmother     breast  . Cancer Paternal Grandmother     breast  . Cancer Paternal Grandfather     colon  . Diabetes Maternal Grandfather    Social History  Substance Use Topics  . Smoking status: Never Smoker   . Smokeless tobacco: Never Used  . Alcohol Use: 1.2 oz/week    1 Glasses of wine, 1 Cans of beer per week     Comment: occ   OB History    No data available     Review of Systems  Constitutional:  Positive for fever and fatigue. Negative for appetite change.  HENT: Negative for congestion, ear discharge and sinus pressure.   Eyes: Negative for discharge.  Respiratory: Positive for cough.   Cardiovascular: Positive for chest pain and syncope.  Gastrointestinal: Negative for abdominal pain and diarrhea.  Genitourinary: Negative for frequency and hematuria.  Musculoskeletal: Negative for back pain.  Skin: Negative for rash.  Neurological: Negative for seizures and headaches.  Psychiatric/Behavioral: Negative for hallucinations.      Allergies  Other and Tomato  Home Medications   Prior to Admission medications   Medication Sig Start Date End Date Taking? Authorizing Provider  dextromethorphan (DELSYM) 30 MG/5ML liquid Take 30 mg by mouth as needed for cough.   Yes Historical Provider, MD  ibuprofen (ADVIL,MOTRIN) 200 MG tablet Take 200 mg by mouth every 6 (six) hours as needed for headache or moderate pain.   Yes Historical Provider, MD  Multiple Vitamins-Minerals (HAIR/SKIN/NAILS) TABS Take 2 tablets by mouth daily.   Yes Historical Provider, MD  naproxen sodium (ANAPROX) 220 MG tablet Take 440 mg by mouth 2 (two) times daily as needed (pain).   Yes Historical Provider, MD  nebivolol (BYSTOLIC) 5 MG tablet Take 5 mg by mouth daily.   Yes Historical Provider, MD  Phenyleph-CPM-DM-APAP (ALKA-SELTZER PLUS COLD & FLU PO) Take 2 tablets by mouth daily as needed (cold symptoms).   Yes Historical Provider, MD  BP 123/76 mmHg  Pulse 79  Temp(Src) 99.3 F (37.4 C) (Oral)  Resp 15  SpO2 98%  LMP 12/13/2015 Physical Exam  Constitutional: She is oriented to person, place, and time. She appears well-developed.  HENT:  Head: Normocephalic.  Eyes: Conjunctivae and EOM are normal. No scleral icterus.  Neck: Neck supple. No thyromegaly present.  Cardiovascular: Normal rate and regular rhythm.  Exam reveals no gallop and no friction rub.   No murmur heard. Pulmonary/Chest: No stridor.  She has no wheezes. She has no rales. She exhibits no tenderness.  Abdominal: She exhibits no distension. There is no tenderness. There is no rebound.  Musculoskeletal: Normal range of motion. She exhibits no edema.  Lymphadenopathy:    She has no cervical adenopathy.  Neurological: She is oriented to person, place, and time. She exhibits normal muscle tone. Coordination normal.  Skin: No rash noted. No erythema.  Psychiatric: She has a normal mood and affect. Her behavior is normal.    ED Course  Procedures (including critical care time) Labs Review Labs Reviewed  BASIC METABOLIC PANEL - Abnormal; Notable for the following:    Chloride 99 (*)    Glucose, Bld 100 (*)    Calcium 8.8 (*)    All other components within normal limits  URINALYSIS, ROUTINE W REFLEX MICROSCOPIC (NOT AT [facility]) - Abnormal; Notable for the following:    Hgb urine dipstick LARGE (*)    All other components within normal limits  HEPATIC FUNCTION PANEL - Abnormal; Notable for the following:    Bilirubin, Direct <0.1 (*)    All other components within normal limits  URINE MICROSCOPIC-ADD ON - Abnormal; Notable for the following:    Squamous Epithelial / LPF 0-5 (*)    Bacteria, UA RARE (*)    All other components within normal limits  CBG MONITORING, ED - Abnormal; Notable for the following:    Glucose-Capillary 100 (*)    All other components within normal limits  CBC  CBG MONITORING, ED  I-STAT BETA HCG BLOOD, ED ([facility], [facility], [facility] ONLY)    Imaging Review Dg Chest 2 View  12/18/2015  CLINICAL DATA:  Several episodes of syncope, cough, nasal congestion, nausea and left-sided rib cage pain for 2 days. Daughter recently diagnosed with flu. History of breast augmentation in 2009. EXAM: CHEST  2 VIEW COMPARISON:  None. FINDINGS: Cardiomediastinal silhouette is normal in size and configuration. Lungs are clear. Lung volumes are normal. No evidence of pneumonia. No pleural effusion. No pneumothorax. Osseous and soft  tissue structures about the chest are unremarkable. IMPRESSION: No acute findings.  No evidence of pneumonia. Electronically Signed   By: Franki Cabot M.D.   On: 12/18/2015 11:51   Ct Head Wo Contrast  12/18/2015  CLINICAL DATA:  35 year old female with several episodes of syncope, cough, nasal congestion and nausea EXAM: CT HEAD WITHOUT CONTRAST TECHNIQUE: Contiguous axial images were obtained from the base of the skull through the vertex without intravenous contrast. COMPARISON:  None. FINDINGS: Negative for acute intracranial hemorrhage, acute infarction, mass, mass effect, hydrocephalus or midline shift. Gray-white differentiation is preserved throughout. No acute soft tissue or calvarial abnormality. The globes and orbits are symmetric and unremarkable. Normal aeration of the mastoid air cells and visualized paranasal sinuses. IMPRESSION: Negative head CT. Electronically Signed   By: Jacqulynn Cadet M.D.   On: 12/18/2015 12:20   I have personally reviewed and evaluated these images and lab results as part of my medical decision-making.   EKG Interpretation  Date/Time:  Monday December 18 2015 10:36:55 EDT Ventricular Rate:  93 PR Interval:  125 QRS Duration: 79 QT Interval:  318 QTC Calculation: 395 R Axis:   85 Text Interpretation:  Sinus rhythm Low voltage, precordial leads  Anteroseptal infarct, old Baseline wander in lead(s) V5 V6 Confirmed by  Fulton Merry  MD, Haylen Bellotti [phone]) on 12/18/2015 4:01:04 PM      MDM   Final diagnoses:  Viral syndrome  Syncope, near    Labs CT scan of head EKG unremarkable. Suspect viral syndrome. Patient is instructed to rest and stay hydrated follow-up with her PCP if not improving   Milton Ferguson, MD 12/19/15 [phone]

## 2017-03-14 IMAGING — CT CT HEAD W/O CM
2 series · 16 of 30 positions shown, 20 images · non-contrast
Comparison: None.

CLINICAL DATA: 34-year-old female with several episodes of syncope,
cough, nasal congestion and nausea

EXAM:
CT HEAD WITHOUT CONTRAST
TECHNIQUE: Contiguous axial images were obtained from the base of the skull
through the vertex without intravenous contrast.

[Series 2: head w/o · axial · non-contrast · 0.45mm/px · z∈[-150,-30]mm · 13 of 28 slices shown, 17 images]
[im 2/28  brain]
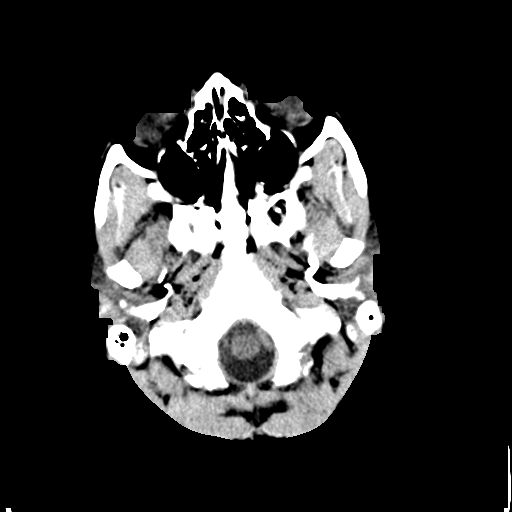
[im 2/28  bone]
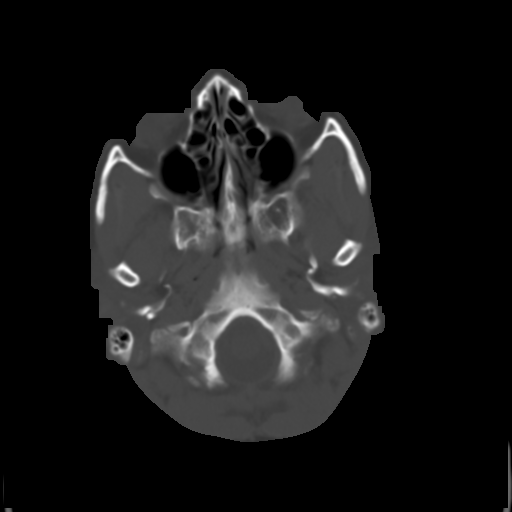
[im 4/28  brain]
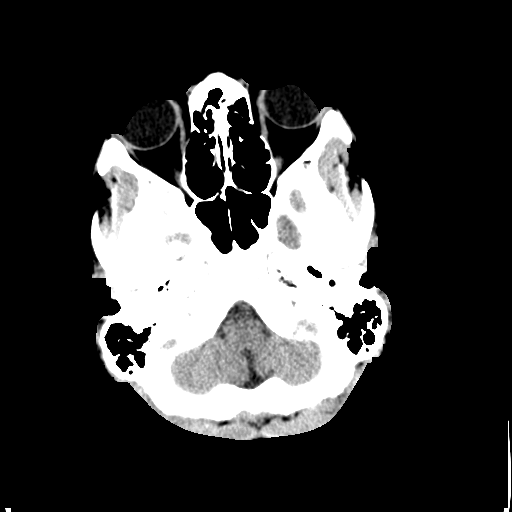
[im 6/28  brain]
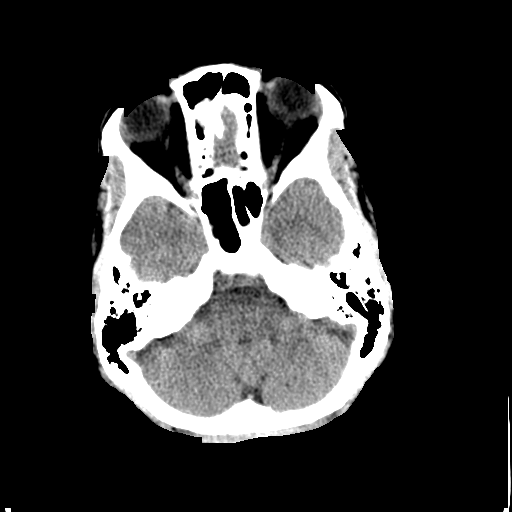
[im 8/28  brain]
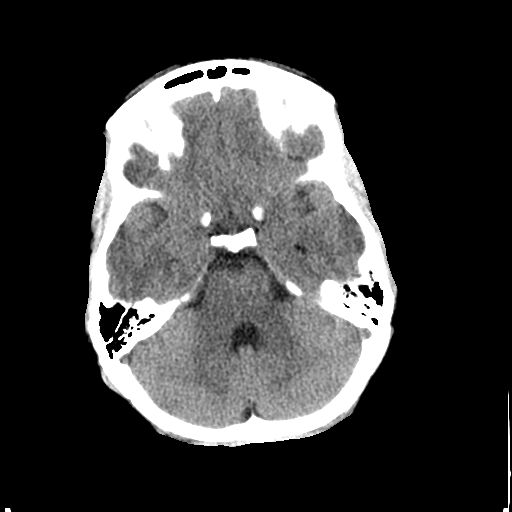
[im 10/28  brain]
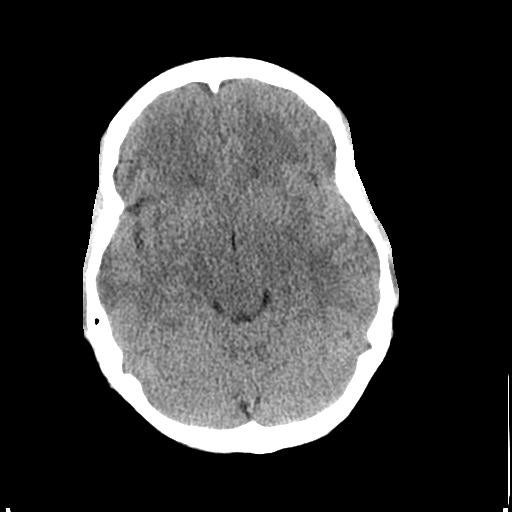
[im 10/28  bone]
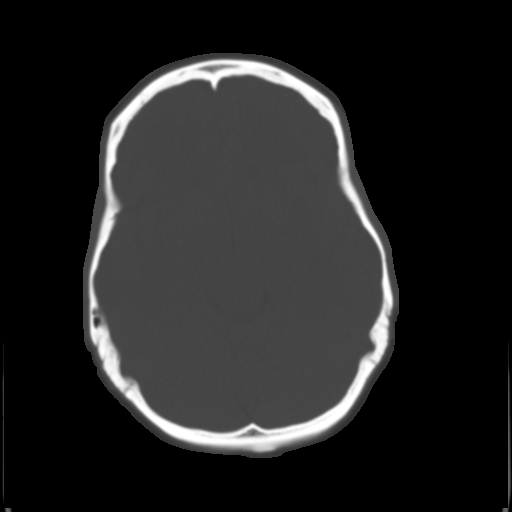
[im 12/28  brain]
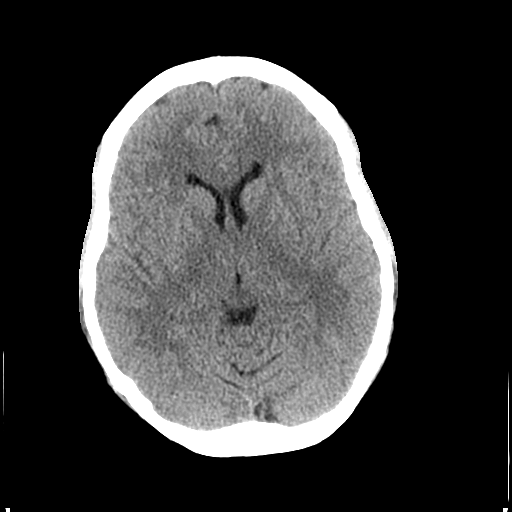
[im 14/28  brain]
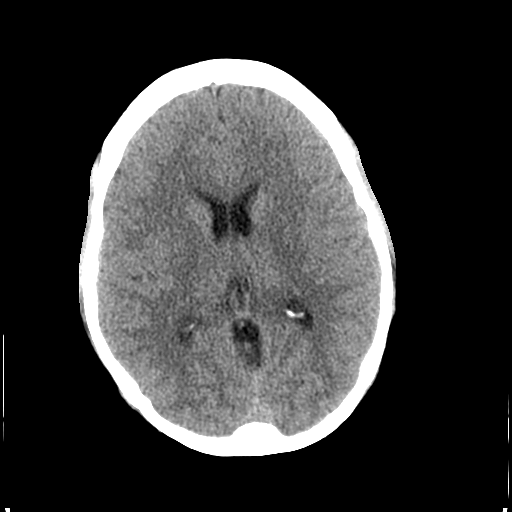
[im 16/28  brain]
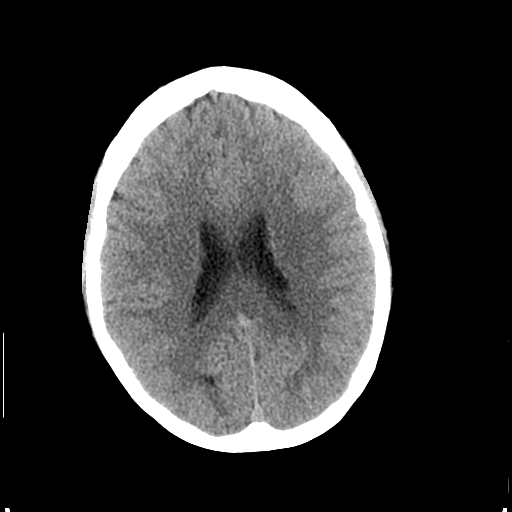
[im 18/28  brain]
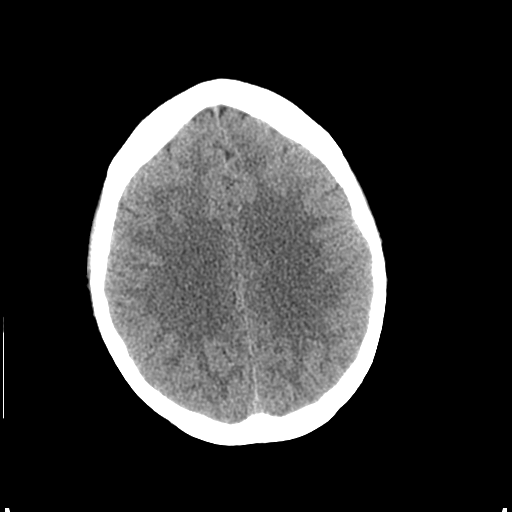
[im 18/28  bone]
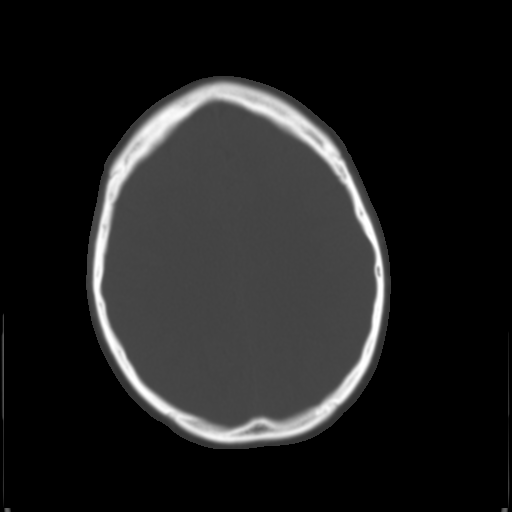
[im 20/28  brain]
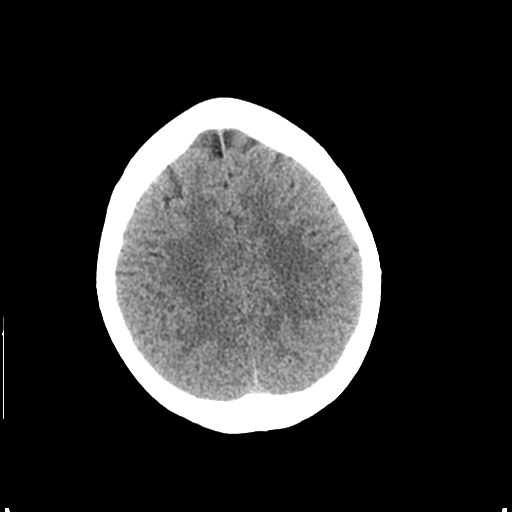
[im 22/28  brain]
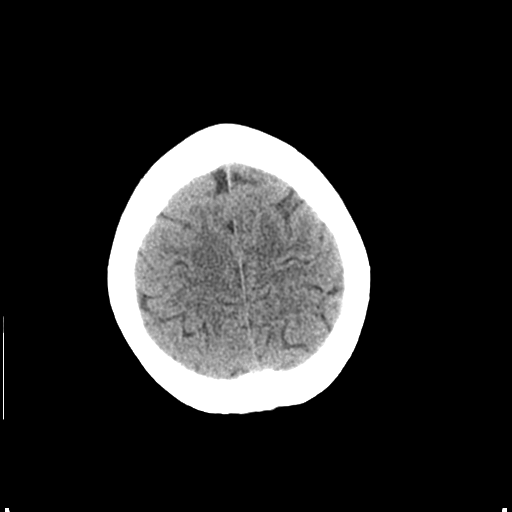
[im 24/28  brain]
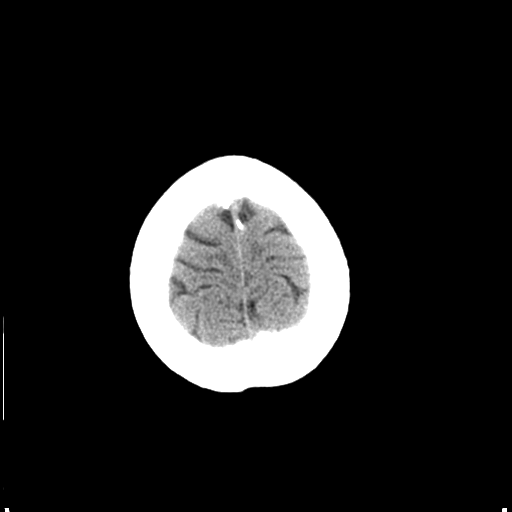
[im 26/28  brain]
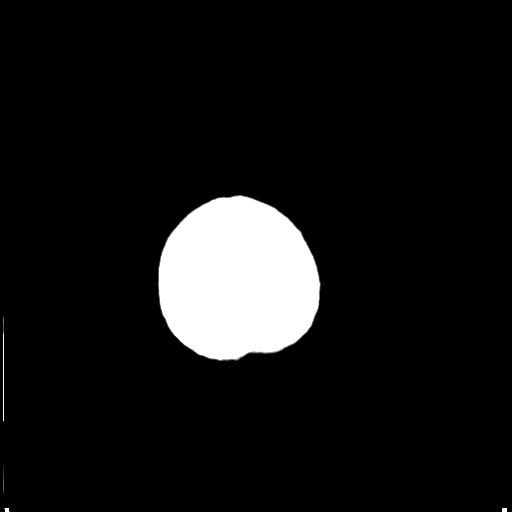
[im 26/28  bone]
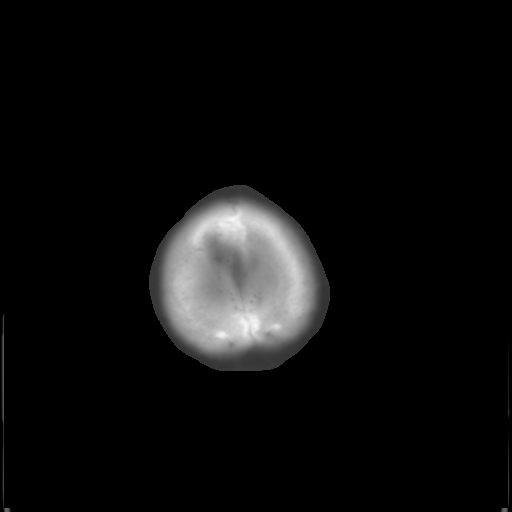

[Series 3: bone windows · axial · 0.45mm/px · z∈[-150,-110]mm · 3 of 28 slices shown]
[im 2/28  bone]
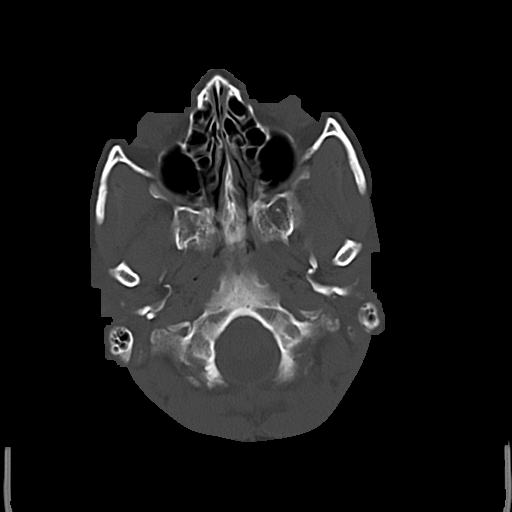
[im 6/28  bone]
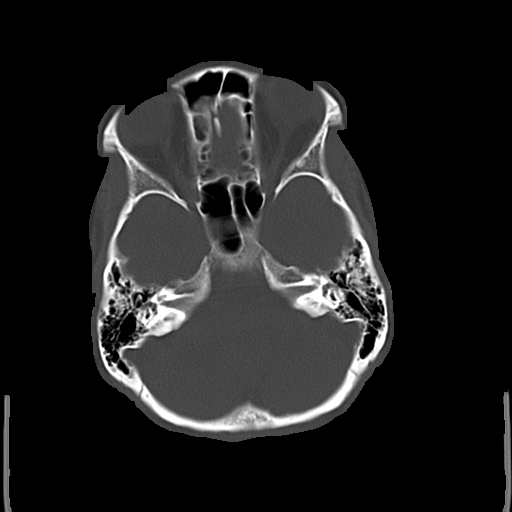
[im 10/28  bone]
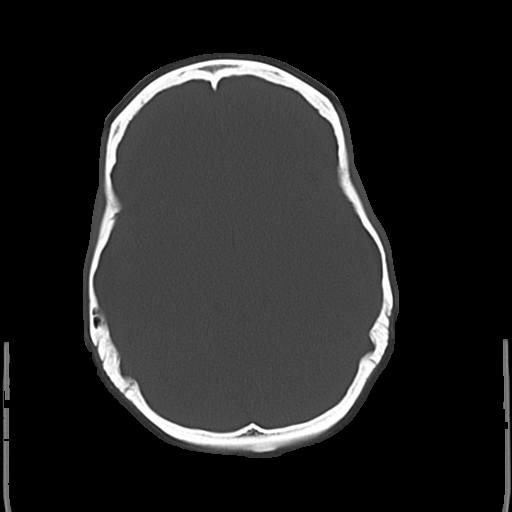

[16 of 30 positions shown; findings below may reference images not displayed]

FINDINGS: Negative for acute intracranial hemorrhage, acute infarction, mass,
mass effect, hydrocephalus or midline shift. Gray-white
differentiation is preserved throughout. No acute soft tissue or
calvarial abnormality. The globes and orbits are symmetric and
unremarkable. Normal aeration of the mastoid air cells and
visualized paranasal sinuses.
IMPRESSION: Negative head CT.

## 2017-03-14 IMAGING — CR DG CHEST 2V
2 series · 2 of 2 positions shown · non-contrast
Comparison: None.

CLINICAL DATA: Several episodes of syncope, cough, nasal
congestion, nausea and left-sided rib cage pain for 2 days. Daughter
recently diagnosed with flu. History of breast augmentation in 3662.

EXAM:
CHEST  2 VIEW

[w chest lat]
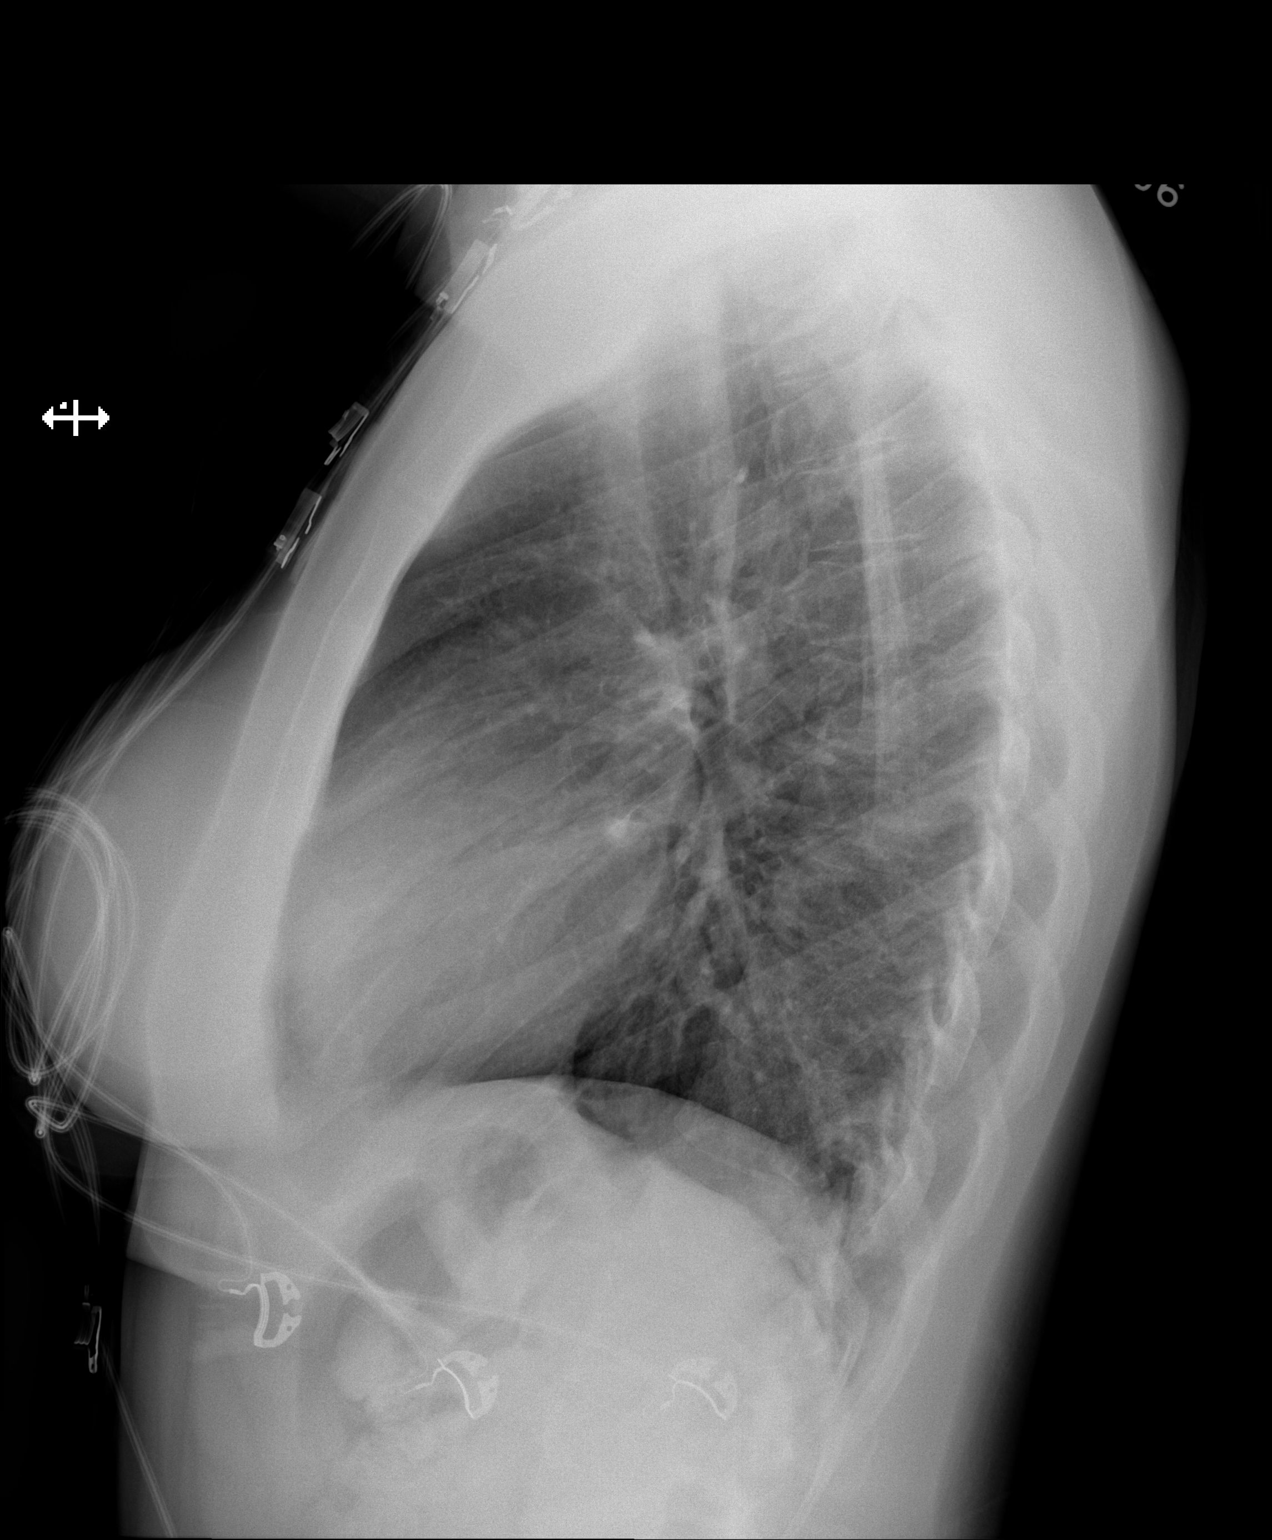

[w chest pa]
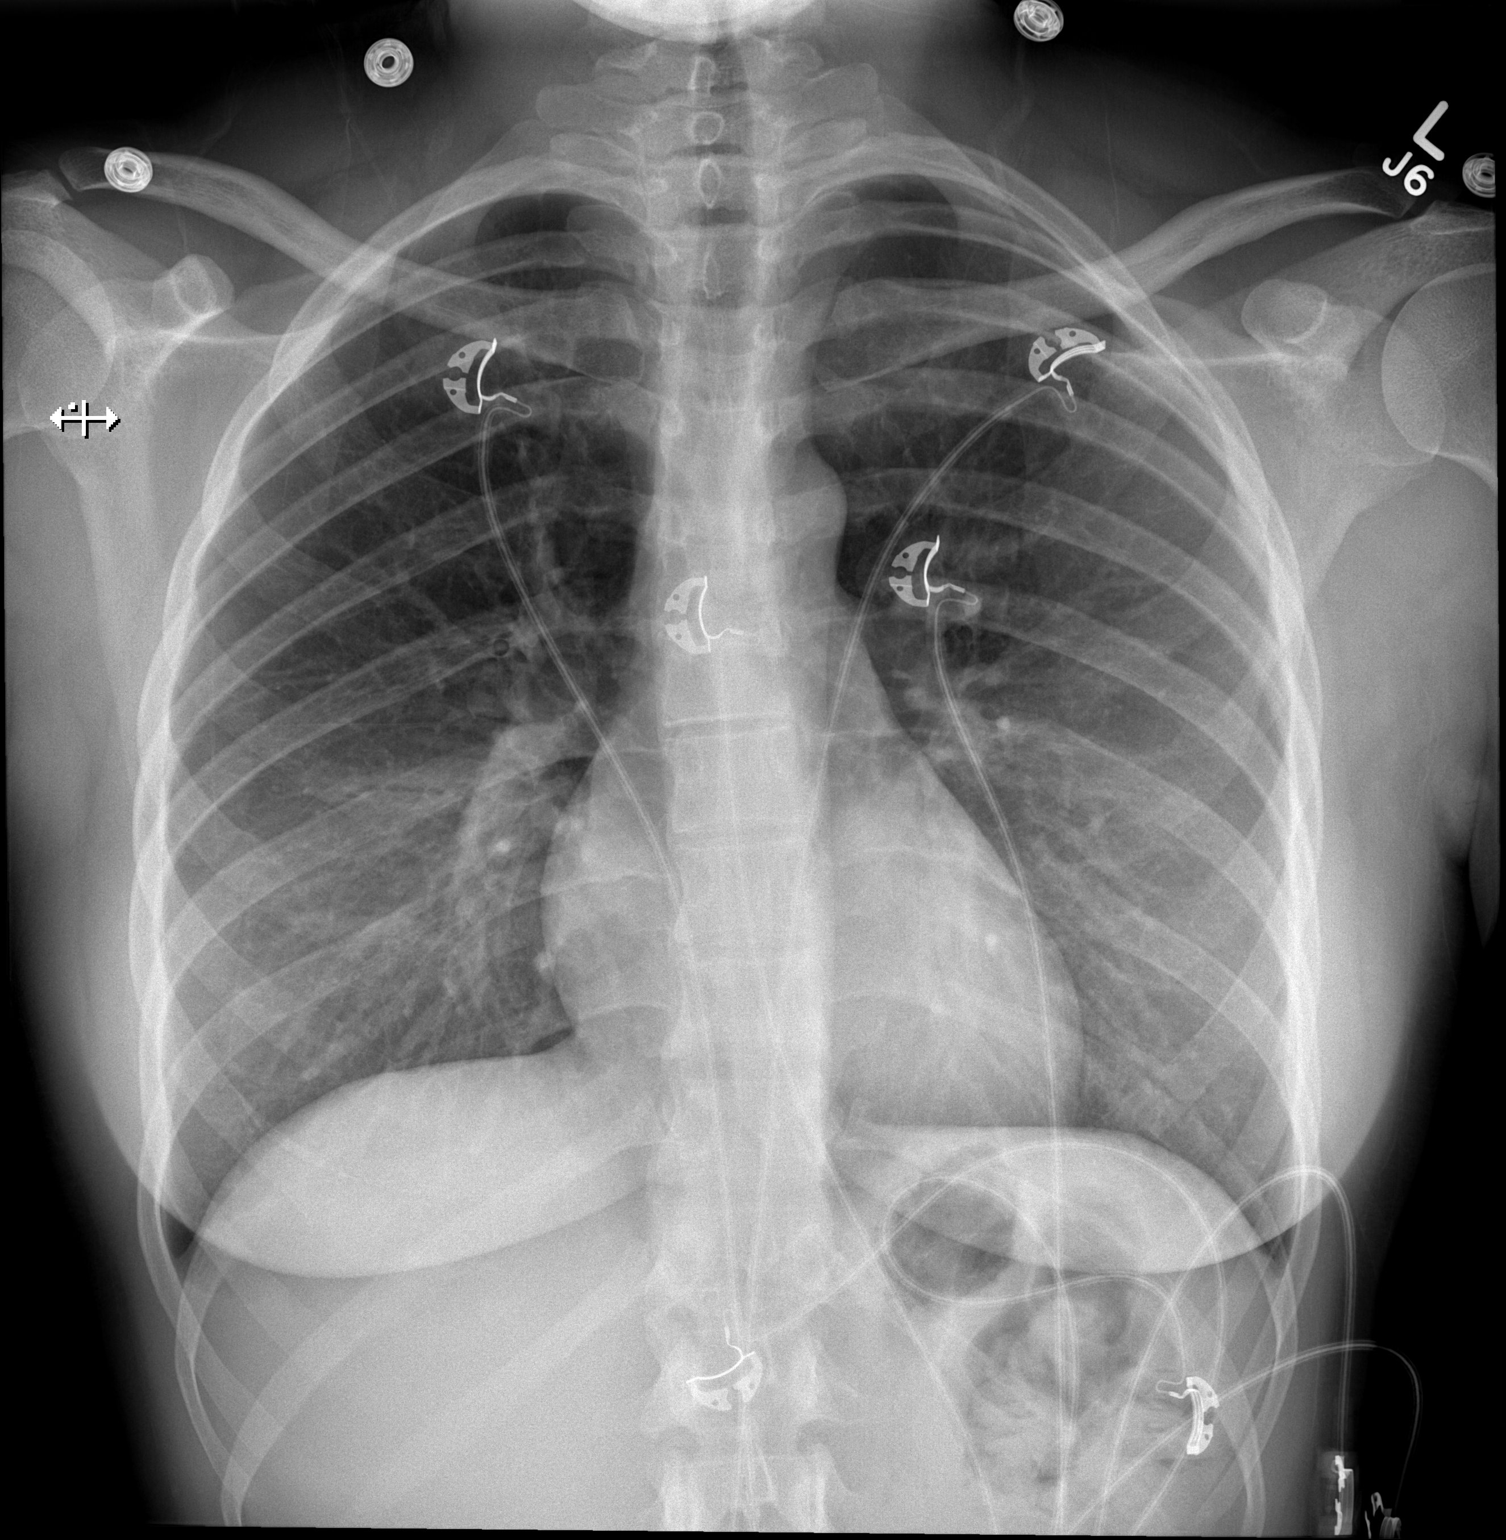

[2 of 2 positions shown; findings below may reference images not displayed]

FINDINGS: Cardiomediastinal silhouette is normal in size and configuration.
Lungs are clear. Lung volumes are normal. No evidence of pneumonia.
No pleural effusion. No pneumothorax.

Osseous and soft tissue structures about the chest are unremarkable.
IMPRESSION: No acute findings.  No evidence of pneumonia.

## 2017-03-27 ENCOUNTER — Other Ambulatory Visit: Payer: Self-pay | Admitting: Internal Medicine

## 2017-03-28 ENCOUNTER — Other Ambulatory Visit: Payer: Self-pay | Admitting: Internal Medicine

## 2017-03-28 DIAGNOSIS — E049 Nontoxic goiter, unspecified: Secondary | ICD-10-CM

## 2017-04-09 ENCOUNTER — Ambulatory Visit (HOSPITAL_COMMUNITY)
Admission: RE | Admit: 2017-04-09 | Discharge: 2017-04-09 | Disposition: A | Discharge status: Home / Self Care | Payer: Managed Care, Other (non HMO) | Source: Ambulatory Visit | Attending: Internal Medicine | Admitting: Internal Medicine

## 2017-04-09 ENCOUNTER — Encounter (HOSPITAL_COMMUNITY): Payer: Self-pay

## 2019-05-26 ENCOUNTER — Other Ambulatory Visit: Payer: Self-pay | Admitting: *Deleted

## 2019-05-26 DIAGNOSIS — Z20822 Contact with and (suspected) exposure to covid-19: Secondary | ICD-10-CM

## 2019-05-27 LAB — NOVEL CORONAVIRUS, NAA: SARS-CoV-2, NAA: NOT DETECTED

## 2019-08-31 ENCOUNTER — Other Ambulatory Visit: Payer: Self-pay

## 2019-08-31 DIAGNOSIS — Z20822 Contact with and (suspected) exposure to covid-19: Secondary | ICD-10-CM

## 2019-09-01 LAB — NOVEL CORONAVIRUS, NAA: SARS-CoV-2, NAA: NOT DETECTED

## 2019-11-08 DIAGNOSIS — Z79899 Other long term (current) drug therapy: Secondary | ICD-10-CM | POA: Diagnosis not present

## 2019-11-08 DIAGNOSIS — F902 Attention-deficit hyperactivity disorder, combined type: Secondary | ICD-10-CM | POA: Diagnosis not present

## 2019-11-08 DIAGNOSIS — F338 Other recurrent depressive disorders: Secondary | ICD-10-CM | POA: Diagnosis not present

## 2019-11-08 DIAGNOSIS — F419 Anxiety disorder, unspecified: Secondary | ICD-10-CM | POA: Diagnosis not present

## 2020-02-07 DIAGNOSIS — Z79899 Other long term (current) drug therapy: Secondary | ICD-10-CM | POA: Diagnosis not present

## 2020-02-07 DIAGNOSIS — F338 Other recurrent depressive disorders: Secondary | ICD-10-CM | POA: Diagnosis not present

## 2020-02-07 DIAGNOSIS — F902 Attention-deficit hyperactivity disorder, combined type: Secondary | ICD-10-CM | POA: Diagnosis not present

## 2020-02-07 DIAGNOSIS — F4010 Social phobia, unspecified: Secondary | ICD-10-CM | POA: Diagnosis not present

## 2020-03-07 DIAGNOSIS — Z118 Encounter for screening for other infectious and parasitic diseases: Secondary | ICD-10-CM | POA: Diagnosis not present

## 2020-03-07 DIAGNOSIS — N760 Acute vaginitis: Secondary | ICD-10-CM | POA: Diagnosis not present

## 2020-03-07 DIAGNOSIS — Z113 Encounter for screening for infections with a predominantly sexual mode of transmission: Secondary | ICD-10-CM | POA: Diagnosis not present

## 2020-03-07 DIAGNOSIS — Z1159 Encounter for screening for other viral diseases: Secondary | ICD-10-CM | POA: Diagnosis not present

## 2020-03-07 DIAGNOSIS — Z114 Encounter for screening for human immunodeficiency virus [HIV]: Secondary | ICD-10-CM | POA: Diagnosis not present

## 2020-03-17 DIAGNOSIS — Z30432 Encounter for removal of intrauterine contraceptive device: Secondary | ICD-10-CM | POA: Diagnosis not present

## 2020-05-12 DIAGNOSIS — I10 Essential (primary) hypertension: Secondary | ICD-10-CM | POA: Diagnosis not present

## 2020-06-02 DIAGNOSIS — Z79899 Other long term (current) drug therapy: Secondary | ICD-10-CM | POA: Diagnosis not present

## 2020-06-02 DIAGNOSIS — F338 Other recurrent depressive disorders: Secondary | ICD-10-CM | POA: Diagnosis not present

## 2020-06-02 DIAGNOSIS — F4010 Social phobia, unspecified: Secondary | ICD-10-CM | POA: Diagnosis not present

## 2020-06-02 DIAGNOSIS — F902 Attention-deficit hyperactivity disorder, combined type: Secondary | ICD-10-CM | POA: Diagnosis not present

## 2020-06-30 DIAGNOSIS — Z3009 Encounter for other general counseling and advice on contraception: Secondary | ICD-10-CM | POA: Diagnosis not present

## 2020-06-30 DIAGNOSIS — N6310 Unspecified lump in the right breast, unspecified quadrant: Secondary | ICD-10-CM | POA: Diagnosis not present

## 2020-07-14 DIAGNOSIS — R922 Inconclusive mammogram: Secondary | ICD-10-CM | POA: Diagnosis not present

## 2020-07-14 DIAGNOSIS — N6313 Unspecified lump in the right breast, lower outer quadrant: Secondary | ICD-10-CM | POA: Diagnosis not present

## 2020-07-15 DIAGNOSIS — I10 Essential (primary) hypertension: Secondary | ICD-10-CM | POA: Diagnosis not present

## 2020-07-15 DIAGNOSIS — Z760 Encounter for issue of repeat prescription: Secondary | ICD-10-CM | POA: Diagnosis not present

## 2020-08-01 ENCOUNTER — Encounter: Payer: Self-pay | Admitting: Internal Medicine

## 2020-08-01 ENCOUNTER — Other Ambulatory Visit: Payer: Self-pay

## 2020-08-01 ENCOUNTER — Ambulatory Visit: Payer: BC Managed Care – PPO | Attending: Internal Medicine | Admitting: Internal Medicine

## 2020-08-01 ENCOUNTER — Ambulatory Visit (HOSPITAL_BASED_OUTPATIENT_CLINIC_OR_DEPARTMENT_OTHER): Payer: BC Managed Care – PPO | Admitting: Pharmacist

## 2020-08-01 VITALS — BP 134/86 | HR 65 | Resp 16 | Ht 66.0 in | Wt 157.0 lb

## 2020-08-01 DIAGNOSIS — Z114 Encounter for screening for human immunodeficiency virus [HIV]: Secondary | ICD-10-CM | POA: Diagnosis not present

## 2020-08-01 DIAGNOSIS — Z1159 Encounter for screening for other viral diseases: Secondary | ICD-10-CM

## 2020-08-01 DIAGNOSIS — E049 Nontoxic goiter, unspecified: Secondary | ICD-10-CM | POA: Diagnosis not present

## 2020-08-01 DIAGNOSIS — Z23 Encounter for immunization: Secondary | ICD-10-CM

## 2020-08-01 DIAGNOSIS — Z7689 Persons encountering health services in other specified circumstances: Secondary | ICD-10-CM | POA: Diagnosis not present

## 2020-08-01 DIAGNOSIS — Z2821 Immunization not carried out because of patient refusal: Secondary | ICD-10-CM

## 2020-08-01 DIAGNOSIS — I10 Essential (primary) hypertension: Secondary | ICD-10-CM | POA: Diagnosis not present

## 2020-08-01 DIAGNOSIS — Z8659 Personal history of other mental and behavioral disorders: Secondary | ICD-10-CM

## 2020-08-01 DIAGNOSIS — E663 Overweight: Secondary | ICD-10-CM

## 2020-08-01 DIAGNOSIS — E559 Vitamin D deficiency, unspecified: Secondary | ICD-10-CM | POA: Diagnosis not present

## 2020-08-01 NOTE — Patient Instructions (Signed)
Check your blood pressure twice a week and record readings.  Goal is 130/80 or lower.  Bring readings with you in 3 weeks to see our clinical pharmacist for blood pressure recheck.   Healthy Eating Following a healthy eating pattern may help you to achieve and maintain a healthy body weight, reduce the risk of chronic disease, and live a long and productive life. It is important to follow a healthy eating pattern at an appropriate calorie level for your body. Your nutritional needs should be met primarily through food by choosing a variety of nutrient-rich foods. What are tips for following this plan? Reading food labels  Read labels and choose the following: ? Reduced or low sodium. ? Juices with 100% fruit juice. ? Foods with low saturated fats and high polyunsaturated and monounsaturated fats. ? Foods with whole grains, such as whole wheat, cracked wheat, brown rice, and wild rice. ? Whole grains that are fortified with folic acid. This is recommended for women who are pregnant or who want to become pregnant.  Read labels and avoid the following: ? Foods with a lot of added sugars. These include foods that contain brown sugar, corn sweetener, corn syrup, dextrose, fructose, glucose, high-fructose corn syrup, honey, invert sugar, lactose, malt syrup, maltose, molasses, raw sugar, sucrose, trehalose, or turbinado sugar.  Do not eat more than the following amounts of added sugar per day:  6 teaspoons (25 g) for women.  9 teaspoons (38 g) for men. ? Foods that contain processed or refined starches and grains. ? Refined grain products, such as white flour, degermed cornmeal, white bread, and white rice. Shopping  Choose nutrient-rich snacks, such as vegetables, whole fruits, and nuts. Avoid high-calorie and high-sugar snacks, such as potato chips, fruit snacks, and candy.  Use oil-based dressings and spreads on foods instead of solid fats such as butter, stick margarine, or cream  cheese.  Limit pre-made sauces, mixes, and "instant" products such as flavored rice, instant noodles, and ready-made pasta.  Try more plant-protein sources, such as tofu, tempeh, black beans, edamame, lentils, nuts, and seeds.  Explore eating plans such as the Mediterranean diet or vegetarian diet. Cooking  Use oil to saut or stir-fry foods instead of solid fats such as butter, stick margarine, or lard.  Try baking, boiling, grilling, or broiling instead of frying.  Remove the fatty part of meats before cooking.  Steam vegetables in water or broth. Meal planning   At meals, imagine dividing your plate into fourths: ? One-half of your plate is fruits and vegetables. ? One-fourth of your plate is whole grains. ? One-fourth of your plate is protein, especially lean meats, poultry, eggs, tofu, beans, or nuts.  Include low-fat dairy as part of your daily diet. Lifestyle  Choose healthy options in all settings, including home, work, school, restaurants, or stores.  Prepare your food safely: ? Wash your hands after handling raw meats. ? Keep food preparation surfaces clean by regularly washing with hot, soapy water. ? Keep raw meats separate from ready-to-eat foods, such as fruits and vegetables. ? Cook seafood, meat, poultry, and eggs to the recommended internal temperature. ? Store foods at safe temperatures. In general:  Keep cold foods at 28F (4.4C) or below.  Keep hot foods at 128F (60C) or above.  Keep your freezer at [facility] (-17.8C) or below.  Foods are no longer safe to eat when they have been between the temperatures of 40-128F (4.4-60C) for more than 2 hours. What foods should I eat? Fruits  Aim to eat 2 cup-equivalents of fresh, canned (in natural juice), or frozen fruits each day. Examples of 1 cup-equivalent of fruit include 1 small apple, 8 large strawberries, 1 cup canned fruit,  cup dried fruit, or 1 cup 100% juice. Vegetables Aim to eat 2-3  cup-equivalents of fresh and frozen vegetables each day, including different varieties and colors. Examples of 1 cup-equivalent of vegetables include 2 medium carrots, 2 cups raw, leafy greens, 1 cup chopped vegetable (raw or cooked), or 1 medium baked potato. Grains Aim to eat 6 ounce-equivalents of whole grains each day. Examples of 1 ounce-equivalent of grains include 1 slice of bread, 1 cup ready-to-eat cereal, 3 cups popcorn, or  cup cooked rice, pasta, or cereal. Meats and other proteins Aim to eat 5-6 ounce-equivalents of protein each day. Examples of 1 ounce-equivalent of protein include 1 egg, 1/2 cup nuts or seeds, or 1 tablespoon (16 g) peanut butter. A cut of meat or fish that is the size of a deck of cards is about 3-4 ounce-equivalents.  Of the protein you eat each week, try to have at least 8 ounces come from seafood. This includes salmon, trout, herring, and anchovies. Dairy Aim to eat 3 cup-equivalents of fat-free or low-fat dairy each day. Examples of 1 cup-equivalent of dairy include 1 cup (240 mL) milk, 8 ounces (250 g) yogurt, 1 ounces (44 g) natural cheese, or 1 cup (240 mL) fortified soy milk. Fats and oils  Aim for about 5 teaspoons (21 g) per day. Choose monounsaturated fats, such as canola and olive oils, avocados, peanut butter, and most nuts, or polyunsaturated fats, such as sunflower, corn, and soybean oils, walnuts, pine nuts, sesame seeds, sunflower seeds, and flaxseed. Beverages  Aim for six 8-oz glasses of water per day. Limit coffee to three to five 8-oz cups per day.  Limit caffeinated beverages that have added calories, such as soda and energy drinks.  Limit alcohol intake to no more than 1 drink a day for nonpregnant women and 2 drinks a day for men. One drink equals 12 oz of beer (355 mL), 5 oz of wine (148 mL), or 1 oz of hard liquor (44 mL). Seasoning and other foods  Avoid adding excess amounts of salt to your foods. Try flavoring foods with herbs and  spices instead of salt.  Avoid adding sugar to foods.  Try using oil-based dressings, sauces, and spreads instead of solid fats. This information is based on general [place] nutrition guidelines. For more information, visit [employer]. Exact amounts may vary based on your nutrition needs. Summary  A healthy eating plan may help you to maintain a healthy weight, reduce the risk of chronic diseases, and stay active throughout your life.  Plan your meals. Make sure you eat the right portions of a variety of nutrient-rich foods.  Try baking, boiling, grilling, or broiling instead of frying.  Choose healthy options in all settings, including home, work, school, restaurants, or stores. This information is not intended to replace advice given to you by your health care provider. Make sure you discuss any questions you have with your health care provider. Document Revised: 12/22/2017 Document Reviewed: 12/22/2017 Elsevier Patient Education  [place].

## 2020-08-01 NOTE — Progress Notes (Signed)
Patient presents for vaccination against tetanus per orders of Dr. Wynetta Emery. Consent given. Counseling provided. No contraindications exists. Vaccine administered without incident.   Benard Halsted, PharmD, [place] [phone]

## 2020-08-01 NOTE — Progress Notes (Signed)
Patient ID: Evelyn Lawson, female    DOB: [birth year]  MRN: [id number]  CC: New Patient (Initial Visit)   Subjective: Evelyn Lawson is a 39 y.o. female who presents for new pt visit Her concerns today include:   Pt wants to est care. Previous PCP was Lynford Citizen at [facility]. Last seen 1-2 yrs ago.  Decided to change because it was too hard to get in. Gives hx of migraines, HTN, ADHD.    HTN: does have home monitor but does not check regularly.  Checks when she has HA. BP was 141/? when she saw her GYN 1 mth ago. -limits salt in foods. No CP/SOB/LE edema/Dizziness -feels her eating not always the healthiest. Drinks water, sodas, juices and coffee.  She loves fruits.  Love potatoes, and greens. -not exercising as much as she use to since she has been working from home.     -Reports no major weight changes over the past year.  Sees Dr. Francine Graven at [facility] for ADHD on Adderall and [place].  Sees her Q 6 mths.  Doing okay on current meds   Will have tubal ligation 08/25/20 by Dr. Garwin Brothers Has mass in RT breast.  MMG 2 wks ago through [place].  Plan is for recheck in 6 mths.  Last PAP was 08/23/2019 through Dr. Garwin Brothers.  Hx of low vit D.  Taking MV tab that contains vit D.  Request to have vitamin D level checked.  Patient noted to have enlarged thyroid today.  She reports having been told in the past that the thyroid was enlarged.  Saw an endocrinologist about 7 years ago and reports being told that if it does not bother her she can leave it alone.  No imaging study was done.  She denies any palpitations.  Endorses feeling hot and cold sometimes.  No problems with shortness of breath.  Reports that when she eats seafood she can smell it on her breath and through her pores for several days.  Wants to know what she can do to alleviate that issue so that she can eat seafood more often.  HM: Declines flu shot. Does not think she had TDap.   Agrees for hep C/HIV screening  Past  medical, social, family history and surgical history reviewed. Patient Active Problem List   Diagnosis Date Noted  . External hemorrhoids with irritation/pain 01/31/2014  . Constipation, chronic 01/31/2014  . Internal hemorrhoids with complication 03/55/9741     Current Outpatient Medications on File Prior to Visit  Medication Sig Dispense Refill  . Methylphenidate HCl ER, PM, (JORNAY PM) 60 MG CP24 Take by mouth.    . naproxen sodium (ANAPROX) 220 MG tablet Take 440 mg by mouth 2 (two) times daily as needed (pain).    Marland Kitchen amphetamine-dextroamphetamine (ADDERALL) 10 MG tablet Take 10 mg by mouth daily.    . Multiple Vitamins-Minerals (HAIR/SKIN/NAILS) TABS Take 2 tablets by mouth daily.    . propranolol ER (INDERAL LA) 160 MG SR capsule Take 160 mg by mouth daily.     No current facility-administered medications on file prior to visit.    Allergies  Allergen Reactions  . Other Swelling and Rash    Peppers   . Tomato Swelling and Rash    Paste is fine     Social History   Socioeconomic History  . Marital status: Single    Spouse name: Not on file  . Number of children: 1  . Years of education: Not  on file  . Highest education level: Bachelor's degree (e.g., BA, AB, BS)  Occupational History  . Occupation: HR specialist  Tobacco Use  . Smoking status: Never Smoker  . Smokeless tobacco: Never Used  Vaping Use  . Vaping Use: Never used  Substance and Sexual Activity  . Alcohol use: Yes    Alcohol/week: 2.0 standard drinks    Types: 1 Glasses of wine, 1 Cans of beer per week    Comment: occ  . Drug use: No  . Sexual activity: Not on file  Other Topics Concern  . Not on file  Social History Narrative  . Not on file   Social Determinants of Health   Financial Resource Strain:   . Difficulty of Paying Living Expenses: Not on file  Food Insecurity:   . Worried About Charity fundraiser in the Last Year: Not on file  . Ran Out of Food in the Last Year: Not on file    Transportation Needs:   . Lack of Transportation (Medical): Not on file  . Lack of Transportation (Non-Medical): Not on file  Physical Activity:   . Days of Exercise per Week: Not on file  . Minutes of Exercise per Session: Not on file  Stress:   . Feeling of Stress : Not on file  Social Connections:   . Frequency of Communication with Friends and Family: Not on file  . Frequency of Social Gatherings with Friends and Family: Not on file  . Attends Religious Services: Not on file  . Active Member of Clubs or Organizations: Not on file  . Attends Archivist Meetings: Not on file  . Marital Status: Not on file  Intimate Partner Violence:   . Fear of Current or Ex-Partner: Not on file  . Emotionally Abused: Not on file  . Physically Abused: Not on file  . Sexually Abused: Not on file    Family History  Problem Relation Age of Onset  . Cancer Maternal Grandmother        breast  . Breast cancer Maternal Grandmother   . Cancer Paternal Grandmother        breast  . Breast cancer Paternal Grandmother   . Cancer Paternal Grandfather        colon  . Diabetes Maternal Grandfather   . Breast cancer Maternal Aunt     Past Surgical History:  Procedure Laterality Date  . BREAST ENHANCEMENT SURGERY  11/30/2008    ROS: Review of Systems Negative except as stated above  PHYSICAL EXAM: BP 134/86   Pulse 65   Resp 16   Ht 5\' 6"  (1.676 m)   Wt 157 lb (71.2 kg)   SpO2 100%   BMI 25.34 kg/m    Physical Exam  General appearance - alert, well appearing, young African-American female and in no distress Mental status - normal mood, behavior, speech, dress, motor activity, and thought processes Eyes - pupils equal and reactive, extraocular eye movements intact Nose - normal and patent, no erythema, discharge or polyps Mouth - mucous membranes moist, pharynx normal without lesions Neck -thyroid diffusely enlarged left side more than right.  No palpable nodules.  No neck  masses appreciated.   Lymphatics -no cervical or axillary lymphadenopathy palpated Chest - clear to auscultation, no wheezes, rales or rhonchi, symmetric air entry Heart - normal rate, regular rhythm, normal S1, S2, no murmurs, rubs, clicks or gallops Extremities - peripheral pulses normal, no pedal edema, no clubbing or cyanosis   CMP  Latest Ref Rng & Units 12/18/2015 05/03/2010  Glucose 65 - 99 mg/dL 100(H) 91  BUN 6 - 20 mg/dL 8 3(L)  Creatinine 0.44 - 1.00 mg/dL 0.97 0.74  Sodium 135 - 145 mmol/L 137 136  Potassium 3.5 - 5.1 mmol/L 3.5 3.6  Chloride 101 - 111 mmol/L 99(L) 105  CO2 22 - 32 mmol/L 29 23  Calcium 8.9 - 10.3 mg/dL 8.8(L) 9.1  Total Protein 6.5 - 8.1 g/dL 7.8 6.6  Total Bilirubin 0.3 - 1.2 mg/dL 0.5 0.6  Alkaline Phos 38 - 126 U/L 39 136(H)  AST 15 - 41 U/L 24 20  ALT 14 - 54 U/L 17 15   Lipid Panel  No results found for: CHOL, TRIG, HDL, CHOLHDL, VLDL, LDLCALC, LDLDIRECT  CBC    Component Value Date/Time   WBC 4.5 12/18/2015 1122   RBC 4.21 12/18/2015 1122   HGB 13.2 12/18/2015 1122   HCT 39.9 12/18/2015 1122   PLT 159 12/18/2015 1122   MCV 94.8 12/18/2015 1122   MCH 31.4 12/18/2015 1122   MCHC 33.1 12/18/2015 1122   RDW 12.7 12/18/2015 1122    ASSESSMENT AND PLAN: 1. Establishing care with new doctor, encounter for  2. Essential hypertension Not at goal.  Advised patient that goal is 130/80 or lower.  We discussed having her check blood pressure at least twice a week and return to see our clinical pharmacist in 3 weeks for repeat check.  Advised to bring her log with her and her home blood pressure device.  If blood pressure still not at goal.  We can consider adding a low-dose of amlodipine.  Low-salt diet encouraged. - Lipid panel - CBC - Comprehensive metabolic panel  3. Thyroid enlargement We discussed evaluating this further by checking thyroid hormone levels and getting a thyroid ultrasound.  Patient agreeable to this plan. - TSH+T4F+T3Free -  US THYROID; Future  4. Vitamin D deficiency - VITAMIN D 25 Hydroxy (Vit-D Deficiency, Fractures)  5. History of ADHD Followed by psychiatry.  6. Over weight Discussed and encourage healthy eating habits.  Advised patient to eliminate sugary drinks from the diet, try to eat more white meat than red meat, cut back on portion sizes of white carbohydrates and incorporate fresh fruits and vegetables into the diet.  7. Need for Tdap vaccination Given today  8. Influenza vaccination declined This was offered.  Patient declined.  9. Screening for HIV (human immunodeficiency virus) - HIV antibody (with reflex)  10. Need for hepatitis C screening test - Hepatitis C Antibody   Patient was given the opportunity to ask questions.  Patient verbalized understanding of the plan and was able to repeat key elements of the plan.   Orders Placed This Encounter  Procedures  . US THYROID  . Lipid panel  . CBC  . Comprehensive metabolic panel  . TSH+T4F+T3Free  . VITAMIN D 25 Hydroxy (Vit-D Deficiency, Fractures)  . Hepatitis C Antibody  . HIV antibody (with reflex)     Requested Prescriptions    No prescriptions requested or ordered in this encounter    Return in about 4 months (around 11/29/2020) for Give appt with [facility] in 3 weeks for BP recheck.  Karle Plumber, MD, FACP

## 2020-08-02 LAB — COMPREHENSIVE METABOLIC PANEL
ALT: 14 IU/L (ref 0–32)
AST: 11 IU/L (ref 0–40)
Albumin/Globulin Ratio: 1.9 (ref 1.2–2.2)
Albumin: 4.9 g/dL — ABNORMAL HIGH (ref 3.8–4.8)
Alkaline Phosphatase: 60 IU/L (ref 44–121)
BUN/Creatinine Ratio: 10 (ref 9–23)
BUN: 10 mg/dL (ref 6–20)
Bilirubin Total: 0.8 mg/dL (ref 0.0–1.2)
CO2: 24 mmol/L (ref 20–29)
Calcium: 9.9 mg/dL (ref 8.7–10.2)
Chloride: 101 mmol/L (ref 96–106)
Creatinine, Ser: 0.96 mg/dL (ref 0.57–1.00)
GFR calc Af Amer: 87 mL/min/{1.73_m2} (ref >59)
GFR calc non Af Amer: 75 mL/min/{1.73_m2} (ref >59)
Globulin, Total: 2.6 g/dL (ref 1.5–4.5)
Glucose: 84 mg/dL (ref 65–99)
Potassium: 4.1 mmol/L (ref 3.5–5.2)
Sodium: 141 mmol/L (ref 134–144)
Total Protein: 7.5 g/dL (ref 6.0–8.5)

## 2020-08-02 LAB — TSH+T4F+T3FREE
Free T4: 1.8 ng/dL — ABNORMAL HIGH (ref 0.82–1.77)
T3, Free: 2.7 pg/mL (ref 2.0–4.4)
TSH: 0.965 u[IU]/mL (ref 0.450–4.500)

## 2020-08-02 LAB — CBC
Hematocrit: 41.2 % (ref 34.0–46.6)
Hemoglobin: 13.8 g/dL (ref 11.1–15.9)
MCH: 31.9 pg (ref 26.6–33.0)
MCHC: 33.5 g/dL (ref 31.5–35.7)
MCV: 95 fL (ref 79–97)
Platelets: 262 10*3/uL (ref 150–450)
RBC: 4.32 x10E6/uL (ref 3.77–5.28)
RDW: 11.3 % — ABNORMAL LOW (ref 11.7–15.4)
WBC: 5 10*3/uL (ref 3.4–10.8)

## 2020-08-02 LAB — VITAMIN D 25 HYDROXY (VIT D DEFICIENCY, FRACTURES): Vit D, 25-Hydroxy: 23.9 ng/mL — ABNORMAL LOW (ref 30.0–100.0)

## 2020-08-02 LAB — LIPID PANEL
Chol/HDL Ratio: 2.4 ratio (ref 0.0–4.4)
Cholesterol, Total: 215 mg/dL — ABNORMAL HIGH (ref 100–199)
HDL: 90 mg/dL (ref >39)
LDL Chol Calc (NIH): 114 mg/dL — ABNORMAL HIGH (ref 0–99)
Triglycerides: 60 mg/dL (ref 0–149)
VLDL Cholesterol Cal: 11 mg/dL (ref 5–40)

## 2020-08-02 LAB — HEPATITIS C ANTIBODY: Hep C Virus Ab: <0.1 s/co ratio (ref 0.0–0.9)

## 2020-08-02 LAB — HIV ANTIBODY (ROUTINE TESTING W REFLEX): HIV Screen 4th Generation wRfx: NONREACTIVE

## 2020-08-04 ENCOUNTER — Encounter: Payer: Self-pay | Admitting: Internal Medicine

## 2020-08-07 ENCOUNTER — Ambulatory Visit (HOSPITAL_COMMUNITY)
Admission: RE | Admit: 2020-08-07 | Discharge: 2020-08-07 | Disposition: A | Discharge status: Home / Self Care | Payer: BC Managed Care – PPO | Source: Ambulatory Visit | Attending: Internal Medicine | Admitting: Internal Medicine

## 2020-08-07 ENCOUNTER — Other Ambulatory Visit: Payer: Self-pay

## 2020-08-07 DIAGNOSIS — E049 Nontoxic goiter, unspecified: Secondary | ICD-10-CM | POA: Diagnosis not present

## 2020-08-07 DIAGNOSIS — E042 Nontoxic multinodular goiter: Secondary | ICD-10-CM | POA: Diagnosis not present

## 2020-08-09 ENCOUNTER — Encounter: Payer: Self-pay | Admitting: Internal Medicine

## 2020-08-09 ENCOUNTER — Telehealth: Payer: Self-pay | Admitting: Internal Medicine

## 2020-08-09 DIAGNOSIS — E041 Nontoxic single thyroid nodule: Secondary | ICD-10-CM

## 2020-08-09 NOTE — Addendum Note (Signed)
Addended by: Karle Plumber B on: 08/09/2020 04:29 PM   Modules accepted: Orders

## 2020-08-09 NOTE — Telephone Encounter (Signed)
Patient sent me a MyChart message needing more information about the results of the thyroid ultrasound.  I was able to speak with her via phone.  Informed her that one nodule was large enough and meet criteria for biopsy.  Told her that the radiologist looks at the size and other characteristics of the nodule to determine whether biopsy would be recommended.  Most thyroid nodules are benign but when one or more meets certain criteria there is a possibility that it could be cancerous and biopsy would be recommended. Options at this point is to go ahead and biopsy at or continue to monitor it for any changes in size or referral to an endocrinologist for second opinion.  Patient reports that cancer diagnosis is high in her family so she would prefer to go ahead with fine-needle biopsy.  I told her that I will go ahead and have it set up through interventional radiology.  I have described the process to her.  After the biopsy the cells are sent to be looked at by the pathologist.  I then get a pathology report stating whether the cells appear benign, suspicious or cancerous.  Usually they also send off for additional studies to check the cells for presence of certain receptors that may be present on cancerous cells.  All questions were answered.  Patient wanting to move forward with having the nodule on the right thyroid biopsy.  Addendum: After going back and looking at the results of her thyroid hormone levels.  I note that even though TSH was normal it was in the low normal range and free T4 was slightly elevated.  Therefore I think her best option would be to have her see the endocrinologist first to make sure she does not have early hyperthyroidism.

## 2020-08-09 NOTE — Telephone Encounter (Signed)
Thank you for the update!Please let me know if there is anything else needed!

## 2020-08-09 NOTE — Telephone Encounter (Signed)
Phone call placed to patient today to discuss the results of the thyroid ultrasound.  I left a message on her voicemail letting her know who I am and that I was calling to discuss this results.  I will have my medical assistant reach out to her with the results.  I also send patient her results via [place].

## 2020-08-16 ENCOUNTER — Other Ambulatory Visit: Payer: Self-pay

## 2020-08-16 ENCOUNTER — Encounter (HOSPITAL_BASED_OUTPATIENT_CLINIC_OR_DEPARTMENT_OTHER): Payer: Self-pay | Admitting: Obstetrics and Gynecology

## 2020-08-16 NOTE — Progress Notes (Signed)
Spoke w/ via phone for pre-op interview--- PT Lab needs dos----   Urine preg            Lab results------ pt getting CBC, BMP, EKG done 08-23-2020 @ 1015 COVID test ------ 08-23-2020 @ 1335 Arrive at ------- 1130 NPO after MN NO Solid Food.  Clear liquids from MN until--- 1030 Medications to take morning of surgery ----- Propranolol Diabetic medication ----- n/a Patient Special Instructions ----- n/a Pre-Op special Istructions ----- n/a Patient verbalized understanding of instructions that were given at this phone interview. Patient denies shortness of breath, chest pain, fever, cough at this phone interview.

## 2020-08-17 ENCOUNTER — Other Ambulatory Visit: Payer: Self-pay | Admitting: Obstetrics and Gynecology

## 2020-08-17 DIAGNOSIS — Z302 Encounter for sterilization: Secondary | ICD-10-CM | POA: Insufficient documentation

## 2020-08-22 ENCOUNTER — Ambulatory Visit: Payer: BC Managed Care – PPO | Admitting: Pharmacist

## 2020-08-23 ENCOUNTER — Other Ambulatory Visit (HOSPITAL_COMMUNITY)
Admission: RE | Admit: 2020-08-23 | Discharge: 2020-08-23 | Disposition: A | Discharge status: Home / Self Care | Payer: BC Managed Care – PPO | Source: Ambulatory Visit | Attending: Obstetrics and Gynecology | Admitting: Obstetrics and Gynecology

## 2020-08-23 ENCOUNTER — Encounter (HOSPITAL_COMMUNITY)
Admission: RE | Admit: 2020-08-23 | Discharge: 2020-08-23 | Disposition: A | Discharge status: Home / Self Care | Payer: BC Managed Care – PPO | Source: Ambulatory Visit | Attending: Obstetrics and Gynecology | Admitting: Obstetrics and Gynecology

## 2020-08-23 ENCOUNTER — Other Ambulatory Visit: Payer: Self-pay

## 2020-08-23 DIAGNOSIS — Z302 Encounter for sterilization: Secondary | ICD-10-CM | POA: Diagnosis not present

## 2020-08-23 DIAGNOSIS — Z3009 Encounter for other general counseling and advice on contraception: Secondary | ICD-10-CM | POA: Diagnosis not present

## 2020-08-23 DIAGNOSIS — Z20822 Contact with and (suspected) exposure to covid-19: Secondary | ICD-10-CM | POA: Insufficient documentation

## 2020-08-23 DIAGNOSIS — Z01818 Encounter for other preprocedural examination: Secondary | ICD-10-CM | POA: Insufficient documentation

## 2020-08-23 DIAGNOSIS — Z01419 Encounter for gynecological examination (general) (routine) without abnormal findings: Secondary | ICD-10-CM | POA: Diagnosis not present

## 2020-08-23 LAB — BASIC METABOLIC PANEL
Anion gap: 7 (ref 5–15)
BUN: 7 mg/dL (ref 6–20)
CO2: 27 mmol/L (ref 22–32)
Calcium: 9 mg/dL (ref 8.9–10.3)
Chloride: 103 mmol/L (ref 98–111)
Creatinine, Ser: 1 mg/dL (ref 0.44–1.00)
GFR, Estimated: >60 mL/min (ref >60)
Glucose, Bld: 96 mg/dL (ref 70–99)
Potassium: 3.8 mmol/L (ref 3.5–5.1)
Sodium: 137 mmol/L (ref 135–145)

## 2020-08-23 LAB — SARS CORONAVIRUS 2 (TAT 6-24 HRS): SARS Coronavirus 2: NEGATIVE

## 2020-08-24 LAB — CBC
HCT: 43.4 % (ref 36.0–46.0)
Hemoglobin: 13.3 g/dL (ref 12.0–15.0)
MCH: 31.9 pg (ref 26.0–34.0)
MCHC: 30.6 g/dL (ref 30.0–36.0)
MCV: 104.1 fL — ABNORMAL HIGH (ref 80.0–100.0)
Platelets: 235 10*3/uL (ref 150–400)
RBC: 4.17 MIL/uL (ref 3.87–5.11)
RDW: 12.9 % (ref 11.5–15.5)
WBC: 7.4 10*3/uL (ref 4.0–10.5)
nRBC: 0 % (ref 0.0–0.2)

## 2020-08-25 ENCOUNTER — Ambulatory Visit (HOSPITAL_BASED_OUTPATIENT_CLINIC_OR_DEPARTMENT_OTHER)
Admission: RE | Admit: 2020-08-25 | Discharge: 2020-08-25 | Disposition: A | Discharge status: Home / Self Care | Payer: BC Managed Care – PPO | Attending: Obstetrics and Gynecology | Admitting: Obstetrics and Gynecology

## 2020-08-25 ENCOUNTER — Ambulatory Visit (HOSPITAL_BASED_OUTPATIENT_CLINIC_OR_DEPARTMENT_OTHER): Payer: BC Managed Care – PPO | Admitting: Anesthesiology

## 2020-08-25 ENCOUNTER — Encounter (HOSPITAL_BASED_OUTPATIENT_CLINIC_OR_DEPARTMENT_OTHER)
Admission: RE | Disposition: A | Discharge status: Home / Self Care | Payer: Self-pay | Source: Home / Self Care | Attending: Obstetrics and Gynecology

## 2020-08-25 ENCOUNTER — Encounter (HOSPITAL_BASED_OUTPATIENT_CLINIC_OR_DEPARTMENT_OTHER): Payer: Self-pay | Admitting: Obstetrics and Gynecology

## 2020-08-25 DIAGNOSIS — Z20822 Contact with and (suspected) exposure to covid-19: Secondary | ICD-10-CM | POA: Insufficient documentation

## 2020-08-25 DIAGNOSIS — Z302 Encounter for sterilization: Secondary | ICD-10-CM | POA: Insufficient documentation

## 2020-08-25 HISTORY — DX: Presence of spectacles and contact lenses: Z97.3

## 2020-08-25 HISTORY — DX: Unspecified hemorrhoids: K64.9

## 2020-08-25 HISTORY — DX: Personal history of other diseases of the nervous system and sense organs: Z86.69

## 2020-08-25 HISTORY — PX: LAPAROSCOPIC TUBAL LIGATION: SHX1937

## 2020-08-25 HISTORY — DX: Nontoxic goiter, unspecified: E04.9

## 2020-08-25 LAB — CBC
HCT: 47.8 % — ABNORMAL HIGH (ref 36.0–46.0)
Hemoglobin: 15.4 g/dL — ABNORMAL HIGH (ref 12.0–15.0)
MCH: 32.2 pg (ref 26.0–34.0)
MCHC: 32.2 g/dL (ref 30.0–36.0)
MCV: 99.8 fL (ref 80.0–100.0)
Platelets: 282 10*3/uL (ref 150–400)
RBC: 4.79 MIL/uL (ref 3.87–5.11)
RDW: 12.7 % (ref 11.5–15.5)
WBC: 5.8 10*3/uL (ref 4.0–10.5)
nRBC: 0 % (ref 0.0–0.2)

## 2020-08-25 LAB — TYPE AND SCREEN
ABO/RH(D): O NEG
Antibody Screen: NEGATIVE

## 2020-08-25 LAB — POCT PREGNANCY, URINE: Preg Test, Ur: NEGATIVE

## 2020-08-25 SURGERY — LIGATION, FALLOPIAN TUBE, LAPAROSCOPIC
Anesthesia: General | Site: Abdomen | Laterality: Bilateral

## 2020-08-25 MED ORDER — ROCURONIUM BROMIDE 10 MG/ML (PF) SYRINGE
PREFILLED_SYRINGE | INTRAVENOUS | Status: DC | PRN
  Administered 2020-08-25: 50 mg via INTRAVENOUS

## 2020-08-25 MED ORDER — ONDANSETRON HCL 4 MG/2ML IJ SOLN
INTRAMUSCULAR | Status: AC
  Filled 2020-08-25: qty 2

## 2020-08-25 MED ORDER — ONDANSETRON HCL 4 MG/2ML IJ SOLN: 4.0000 mg | Freq: Once | INTRAMUSCULAR | Status: DC | PRN

## 2020-08-25 MED ORDER — IBUPROFEN 800 MG PO TABS: 800.0000 mg | ORAL_TABLET | Freq: Three times a day (TID) | ORAL | 0 refills | Status: DC | PRN

## 2020-08-25 MED ORDER — BUPIVACAINE HCL (PF) 0.25 % IJ SOLN
INTRAMUSCULAR | Status: DC | PRN
  Administered 2020-08-25: 5 mL

## 2020-08-25 MED ORDER — KETOROLAC TROMETHAMINE 30 MG/ML IJ SOLN: 30.0000 mg | Freq: Once | INTRAMUSCULAR | Status: DC | PRN

## 2020-08-25 MED ORDER — DEXAMETHASONE SODIUM PHOSPHATE 10 MG/ML IJ SOLN
INTRAMUSCULAR | Status: AC
  Filled 2020-08-25: qty 1

## 2020-08-25 MED ORDER — LACTATED RINGERS IV SOLN: INTRAVENOUS | Status: DC

## 2020-08-25 MED ORDER — KETOROLAC TROMETHAMINE 30 MG/ML IJ SOLN
INTRAMUSCULAR | Status: AC
  Filled 2020-08-25: qty 1

## 2020-08-25 MED ORDER — MIDAZOLAM HCL 2 MG/2ML IJ SOLN
INTRAMUSCULAR | Status: AC
  Filled 2020-08-25: qty 2

## 2020-08-25 MED ORDER — LIDOCAINE 2% (20 MG/ML) 5 ML SYRINGE
INTRAMUSCULAR | Status: DC | PRN
  Administered 2020-08-25: 100 mg via INTRAVENOUS

## 2020-08-25 MED ORDER — PROPOFOL 10 MG/ML IV BOLUS
INTRAVENOUS | Status: AC
  Filled 2020-08-25: qty 20

## 2020-08-25 MED ORDER — OXYCODONE HCL 5 MG/5ML PO SOLN: 5.0000 mg | Freq: Once | ORAL | Status: DC | PRN

## 2020-08-25 MED ORDER — HYDROMORPHONE HCL 1 MG/ML IJ SOLN: 0.2500 mg | INTRAMUSCULAR | Status: DC | PRN

## 2020-08-25 MED ORDER — FENTANYL CITRATE (PF) 250 MCG/5ML IJ SOLN
INTRAMUSCULAR | Status: DC | PRN
  Administered 2020-08-25 (×2): 50 ug via INTRAVENOUS

## 2020-08-25 MED ORDER — POVIDONE-IODINE 10 % EX SWAB: 2.0000 "application " | Freq: Once | CUTANEOUS | Status: DC

## 2020-08-25 MED ORDER — KETOROLAC TROMETHAMINE 30 MG/ML IJ SOLN
INTRAMUSCULAR | Status: DC | PRN
  Administered 2020-08-25: 30 mg via INTRAVENOUS

## 2020-08-25 MED ORDER — ONDANSETRON HCL 4 MG/2ML IJ SOLN
INTRAMUSCULAR | Status: DC | PRN
  Administered 2020-08-25: 4 mg via INTRAVENOUS

## 2020-08-25 MED ORDER — OXYCODONE-ACETAMINOPHEN 5-325 MG PO TABS
1.0000 | ORAL_TABLET | ORAL | 0 refills | Status: AC | PRN
Start: 2020-08-25 — End: 2020-08-30

## 2020-08-25 MED ORDER — FENTANYL CITRATE (PF) 100 MCG/2ML IJ SOLN
INTRAMUSCULAR | Status: AC
  Filled 2020-08-25: qty 2

## 2020-08-25 MED ORDER — DEXAMETHASONE SODIUM PHOSPHATE 10 MG/ML IJ SOLN
INTRAMUSCULAR | Status: DC | PRN
  Administered 2020-08-25: 10 mg via INTRAVENOUS

## 2020-08-25 MED ORDER — OXYCODONE HCL 5 MG PO TABS: 5.0000 mg | ORAL_TABLET | Freq: Once | ORAL | Status: DC | PRN

## 2020-08-25 MED ORDER — MIDAZOLAM HCL 5 MG/5ML IJ SOLN
INTRAMUSCULAR | Status: DC | PRN
  Administered 2020-08-25: 2 mg via INTRAVENOUS

## 2020-08-25 MED ORDER — PROPOFOL 10 MG/ML IV BOLUS
INTRAVENOUS | Status: DC | PRN
  Administered 2020-08-25: 130 mg via INTRAVENOUS

## 2020-08-25 MED ORDER — SUGAMMADEX SODIUM 200 MG/2ML IV SOLN
INTRAVENOUS | Status: DC | PRN
  Administered 2020-08-25: 200 mg via INTRAVENOUS

## 2020-08-25 SURGICAL SUPPLY — 28 items
ADH SKN CLS APL DERMABOND .7 (GAUZE/BANDAGES/DRESSINGS) ×1
CATH ROBINSON RED A/P 16FR (CATHETERS) ×3 IMPLANT
DERMABOND ADVANCED (GAUZE/BANDAGES/DRESSINGS) ×2
DERMABOND ADVANCED .7 DNX12 (GAUZE/BANDAGES/DRESSINGS) ×1 IMPLANT
DRAPE SHEET LG 3/4 BI-LAMINATE (DRAPES) ×3 IMPLANT
DRSG OPSITE POSTOP 3X4 (GAUZE/BANDAGES/DRESSINGS) IMPLANT
DURAPREP 26ML APPLICATOR (WOUND CARE) ×3 IMPLANT
GLOVE BIOGEL PI IND STRL 7.0 (GLOVE) IMPLANT
GLOVE BIOGEL PI IND STRL 7.5 (GLOVE) ×2 IMPLANT
GLOVE BIOGEL PI INDICATOR 7.0 (GLOVE)
GLOVE BIOGEL PI INDICATOR 7.5 (GLOVE) ×4
GLOVE ECLIPSE 6.5 STRL STRAW (GLOVE) ×3 IMPLANT
GLOVE NEODERM STER SZ 7 (GLOVE) ×3 IMPLANT
GOWN STRL REUS W/ TWL LRG LVL3 (GOWN DISPOSABLE) ×2 IMPLANT
GOWN STRL REUS W/TWL LRG LVL3 (GOWN DISPOSABLE) ×6
KIT TURNOVER CYSTO (KITS) ×3 IMPLANT
NEEDLE INSUFFLATION 120MM (ENDOMECHANICALS) ×3 IMPLANT
PACK LAPAROSCOPY BASIN (CUSTOM PROCEDURE TRAY) ×3 IMPLANT
PACK TRENDGUARD 450 HYBRID PRO (MISCELLANEOUS) ×1 IMPLANT
PROTECTOR NERVE ULNAR (MISCELLANEOUS) ×6 IMPLANT
SET TUBE SMOKE EVAC HIGH FLOW (TUBING) ×3 IMPLANT
SUT VIC AB 4-0 PS2 18 (SUTURE) ×3 IMPLANT
SUT VICRYL 0 UR6 27IN ABS (SUTURE) ×3 IMPLANT
TRENDGUARD 450 HYBRID PRO PACK (MISCELLANEOUS) ×3
TROCAR BLADELESS OPT 5 100 (ENDOMECHANICALS) ×3 IMPLANT
TROCAR OPTI TIP 5M 100M (ENDOMECHANICALS) ×3 IMPLANT
TROCAR XCEL DIL TIP R 11M (ENDOMECHANICALS) ×6 IMPLANT
WARMER LAPAROSCOPE (MISCELLANEOUS) ×3 IMPLANT

## 2020-08-25 NOTE — Transfer of Care (Signed)
Immediate Anesthesia Transfer of Care Note  Patient: Evelyn Lawson  Procedure(s) Performed: LAPAROSCOPIC TUBAL LIGATION WITH BIPOLAR CAUTERY (Bilateral Abdomen)  Patient Location: PACU  Anesthesia Type:General  Level of Consciousness: awake, alert , oriented and patient cooperative  Airway & Oxygen Therapy: Patient Spontanous Breathing and Patient connected to nasal cannula oxygen  Post-op Assessment: Report given to RN, Post -op Vital signs reviewed and stable and Patient moving all extremities  Post vital signs: Reviewed and stable  Last Vitals:  Vitals Value Taken Time  BP 119/81 08/25/20 1415  Temp    Pulse    Resp 17 08/25/20 1420  SpO2    Vitals shown include unvalidated device data.  Last Pain:  Vitals:   08/25/20 1157  TempSrc: Oral  PainSc: 0-No pain      Patients Stated Pain Goal: 6 (63/78/58 [id number])  Complications: No complications documented.

## 2020-08-25 NOTE — H&P (Signed)
Evelyn Lawson is an 39 y.o. female G83P1011 F presents for LTL with bipolar cautery due to desired permanent sterilization  Pertinent Gynecological History: Menses: usually lasting less than 6 days Bleeding: nl Contraception: none DES exposure: denies Blood transfusions: none Sexually transmitted diseases: none Previous GYN Procedures: DNC  Last mammogram: n/a Date: n/a Last pap: normal Date: 2020 OB History: G2P1011   Menstrual History: Menarche age: n/a Patient's last menstrual period was 07/29/2020 (exact date).    Past Medical History:  Diagnosis Date  . ADHD (attention deficit hyperactivity disorder)   . Enlarged thyroid    ultasound in epic 08-07-2020  bilateral nodules   (08-16-2020 per pt pcp has referred her to an endocrinologist)  . Hemorrhoids   . History of seizures as a child    last febrile seizure age 17  . Hypertension    followed by pcp  . Social anxiety disorder   . Wears contact lenses     Past Surgical History:  Procedure Laterality Date  . BREAST ENHANCEMENT SURGERY  11/30/2008  . HEMORRHOID SURGERY  2013    Family History  Problem Relation Age of Onset  . Cancer Maternal Grandmother        breast  . Breast cancer Maternal Grandmother   . Cancer Paternal Grandmother        breast  . Breast cancer Paternal Grandmother   . Cancer Paternal Grandfather        colon  . Diabetes Maternal Grandfather   . Breast cancer Maternal Aunt     Social History:  reports that she has never smoked. She has never used smokeless tobacco. She reports current alcohol use. She reports that she does not use drugs.  Allergies:  Allergies  Allergen Reactions  . Other Swelling and Rash    Peppers   . Tomato Swelling and Rash    Paste is fine     No medications prior to admission.    Review of Systems  All other systems reviewed and are negative.   Height 5\' 6"  (1.676 m), weight 72.6 kg, last menstrual period 07/29/2020. Physical Exam Constitutional:       Appearance: Normal appearance.  Eyes:     Extraocular Movements: Extraocular movements intact.  Cardiovascular:     Rate and Rhythm: Regular rhythm.     Heart sounds: Normal heart sounds.  Pulmonary:     Breath sounds: Normal breath sounds.  Abdominal:     General: Abdomen is flat.     Palpations: Abdomen is soft.  Genitourinary:    General: Normal vulva.     Comments: Vagina: (+) blood ( on menses) Cervix parous Uterus AV nl Adnexa nonpalp Musculoskeletal:     Cervical back: Neck supple.  Skin:    General: Skin is warm and dry.  Neurological:     Mental Status: She is alert and oriented to person, place, and time.  Psychiatric:        Mood and Affect: Mood normal.        Behavior: Behavior normal.     No results found for this or any previous visit (from the past 24 hour(s)).  No results found.  Assessment/Plan: Desires sterilization P) LTL with bipolar cautery Risk of surgery reviewed including infection, bleeding, injury to underlying organ structures, thermal injury, permanence, nonreversible, failure rate 1/500-1/600. All ? answered  Evelyn Lawson A Evelyn Lawson 08/25/2020, 4:15 AM

## 2020-08-25 NOTE — Discharge Instructions (Signed)
  Post Anesthesia Home Care Instructions  Activity: Get plenty of rest for the remainder of the day. A responsible adult should stay with you for 24 hours following the procedure.  For the next 24 hours, DO NOT: -Drive a car -Paediatric nurse -Drink alcoholic beverages -Take any medication unless instructed by your physician -Make any legal decisions or sign important papers.  Meals: Start with liquid foods such as gelatin or soup. Progress to regular foods as tolerated. Avoid greasy, spicy, heavy foods. If nausea and/or vomiting occur, drink only clear liquids until the nausea and/or vomiting subsides. Call your physician if vomiting continues.  Special Instructions/Symptoms: Your throat may feel dry or sore from the anesthesia or the breathing tube placed in your throat during surgery. If this causes discomfort, gargle with warm salt water. The discomfort should disappear within 24 hours.  If you had a scopolamine patch placed behind your ear for the management of post- operative nausea and/or vomiting:  1. The medication in the patch is effective for 72 hours, after which it should be removed.  Wrap patch in a tissue and discard in the trash. Wash hands thoroughly with soap and water. 2. You may remove the patch earlier than 72 hours if you experience unpleasant side effects which may include dry mouth, dizziness or visual disturbances. 3. Avoid touching the patch. Wash your hands with soap and water after contact with the patch.   Warm compress to abdomen every 4 hours x 24 hrs. Nothing per vagina for 2 wk. Call if severe abdominal pain, nausea, vomiting or incisional drainage or redness

## 2020-08-25 NOTE — Brief Op Note (Signed)
08/25/2020  2:12 PM  PATIENT:  Evelyn Lawson  39 y.o. female  PRE-OPERATIVE DIAGNOSIS:  sterilization  POST-OPERATIVE DIAGNOSIS:  sterilization  PROCEDURE:  Procedure(s): LAPAROSCOPIC TUBAL LIGATION WITH BIPOLAR CAUTERY (Bilateral)  SURGEON:  Surgeon(s) and Role:    * Kailoni Vahle, Alanda Slim, MD - Primary  PHYSICIAN ASSISTANT:   ASSISTANTS: none   ANESTHESIA:   general Findings: nl tubes and ovaries, nl uterus, nl liver edge, no evidence of endometriosis in cul de sac( however pt was on cycle), nl appendix EBL:  10 mL   BLOOD ADMINISTERED:none  DRAINS: none   LOCAL MEDICATIONS USED:  MARCAINE     SPECIMEN:  No Specimen  DISPOSITION OF SPECIMEN:  N/A  COUNTS:  YES  TOURNIQUET:  * No tourniquets in log *  DICTATION: .Other Dictation: Dictation Number [id number]  PLAN OF CARE: Discharge to home after PACU  PATIENT DISPOSITION:  PACU - hemodynamically stable.   Delay start of Pharmacological VTE agent (>24hrs) due to surgical blood loss or risk of bleeding: no

## 2020-08-25 NOTE — Anesthesia Procedure Notes (Signed)
Procedure Name: Intubation Date/Time: 08/25/2020 1:19 PM Performed by: Myna Bright, CRNA Pre-anesthesia Checklist: Patient identified, Emergency Drugs available, Suction available and Patient being monitored Patient Re-evaluated:Patient Re-evaluated prior to induction Oxygen Delivery Method: Circle system utilized Preoxygenation: Pre-oxygenation with 100% oxygen Induction Type: IV induction Ventilation: Mask ventilation without difficulty Laryngoscope Size: Mac and 3 Grade View: Grade I Tube type: Oral Tube size: 7.0 mm Number of attempts: 1 Airway Equipment and Method: Stylet Placement Confirmation: ETT inserted through vocal cords under direct vision,  positive ETCO2 and breath sounds checked- equal and bilateral Secured at: 21 cm Tube secured with: Tape Dental Injury: Teeth and Oropharynx as per pre-operative assessment

## 2020-08-25 NOTE — Anesthesia Postprocedure Evaluation (Signed)
Anesthesia Post Note  Patient: Evelyn Lawson  Procedure(s) Performed: LAPAROSCOPIC TUBAL LIGATION WITH BIPOLAR CAUTERY (Bilateral Abdomen)     Patient location during evaluation: PACU Anesthesia Type: General Level of consciousness: awake and alert Pain management: pain level controlled Vital Signs Assessment: post-procedure vital signs reviewed and stable Respiratory status: spontaneous breathing, nonlabored ventilation, respiratory function stable and patient connected to nasal cannula oxygen Cardiovascular status: blood pressure returned to baseline and stable Postop Assessment: no apparent nausea or vomiting Anesthetic complications: no   No complications documented.  Last Vitals:  Vitals:   08/25/20 1430 08/25/20 1445  BP: 102/68 116/75  Pulse: 66 70  Resp: 17 14  Temp:    SpO2: 100% 100%    Last Pain:  Vitals:   08/25/20 1445  TempSrc:   PainSc: 0-No pain                 Barnet Glasgow

## 2020-08-25 NOTE — Anesthesia Preprocedure Evaluation (Signed)
Anesthesia Evaluation  Patient identified by MRN, date of birth, ID band Patient awake    Reviewed: Allergy & Precautions, NPO status   Airway Mallampati: II  TM Distance: >3 FB Neck ROM: Full    Dental no notable dental hx. (+) Teeth Intact, Dental Advisory Given   Pulmonary neg pulmonary ROS,    Pulmonary exam normal breath sounds clear to auscultation       Cardiovascular hypertension, Normal cardiovascular exam Rhythm:Regular Rate:Normal     Neuro/Psych Anxiety negative neurological ROS     GI/Hepatic negative GI ROS, Neg liver ROS,   Endo/Other  negative endocrine ROS  Renal/GU Lab Results      Component                Value               Date                      CREATININE               1.00                08/23/2020                BUN                      7                   08/23/2020                NA                       137                 08/23/2020                K                        3.8                 08/23/2020                CL                       103                 08/23/2020                CO2                      27                  08/23/2020                Musculoskeletal   Abdominal   Peds  Hematology Lab Results      Component                Value               Date                      WBC                      7.4  08/23/2020                HGB                      13.3                08/23/2020                HCT                      43.4                08/23/2020                MCV                      104.1 (H)           08/23/2020                PLT                      235                 08/23/2020              Anesthesia Other Findings   Reproductive/Obstetrics negative OB ROS                             Anesthesia Physical Anesthesia Plan  ASA: II  Anesthesia Plan: General   Post-op Pain Management:    Induction:  Intravenous  PONV Risk Score and Plan: 4 or greater and Treatment may vary due to age or medical condition, Midazolam, Dexamethasone and Ondansetron  Airway Management Planned: Oral ETT  Additional Equipment:   Intra-op Plan:   Post-operative Plan: Extubation in OR  Informed Consent: I have reviewed the patients History and Physical, chart, labs and discussed the procedure including the risks, benefits and alternatives for the proposed anesthesia with the patient or authorized representative who has indicated his/her understanding and acceptance.     Dental advisory given  Plan Discussed with:   Anesthesia Plan Comments:         Anesthesia Quick Evaluation

## 2020-08-25 NOTE — Interval H&P Note (Signed)
History and Physical Interval Note:  08/25/2020 12:36 PM  Evelyn Lawson  has presented today for surgery, with the diagnosis of sterilization.  The various methods of treatment have been discussed with the patient and family. After consideration of risks, benefits and other options for treatment, the patient has consented to  Procedure(s): LAPAROSCOPIC TUBAL LIGATION (Bilateral) as a surgical intervention.  The patient's history has been reviewed, patient examined, no change in status, stable for surgery.  I have reviewed the patient's chart and labs.  Questions were answered to the patient's satisfaction.     [name] A Liel Rudden

## 2020-08-26 MED ORDER — PROPRANOLOL HCL ER 160 MG PO CP24: 160.0000 mg | ORAL_CAPSULE | Freq: Every day | ORAL | 6 refills | Status: DC

## 2020-08-26 NOTE — Op Note (Signed)
NAME: Evelyn Lawson, HAUTER MEDICAL RECORD EN:[id number] ACCOUNT [id number] DATE OF BIRTH:22-Mar-1981 FACILITY: [facility] LOCATION: [facility] PHYSICIAN:Doralyn Kirkes A. Erendida Wrenn, MD  OPERATIVE REPORT  DATE OF PROCEDURE:  08/25/2020  PREOPERATIVE DIAGNOSIS:  Desires sterilization.  PROCEDURE:  Laparoscopic tubal ligation with bipolar cautery.  POSTOPERATIVE DIAGNOSIS:  Desires sterilization.  ANESTHESIA:  General.  SURGEON:  Servando Salina, MD  ASSISTANT:  None.  DESCRIPTION OF PROCEDURE:  Under adequate general anesthesia, the patient was placed in the dorsal lithotomy position.  She was sterilely prepped and draped in usual fashion.  The bladder was catheterized for a moderate amount of urine.  Bivalve speculum  was placed in the vagina.  Single tooth tenaculum was placed on the anterior lip of the cervix.  An acorn cannula was introduced into the cervical os and attached to the tenaculum for manipulation of the uterus.  The bivalve speculum was then removed.   Attention was then turned to the abdomen.  Marcaine 0.25% was injected infraumbilical  and suprapubic area.  A small infraumbilical incision was then made.  A Veress needle was introduced and tested with sterile water.  Opening pressure of 5 was noted.  2.8 liters of CO2  was insufflated.  The Veress needle was then removed.  A 10 mm disposable trocar with sleeve was introduced into the abdomen without incident.  A lighted video laparoscope was then inserted confirming entry into the abdomen without incident. Panoramic inspection was done.  Normal liver edge was noted. Normal uterus was noted. Incision was then made suprapubic site. 5 mm trocar was placed under direct visualization. A probe was then used to complete the inspection. Normal tubes and ovaries were noted bilaterally. No evidence of endometriosis was noted in the cul de sac however patient was on her cycle.  The appendix was normal however some adhesion were noted with bowels on the  right side wall. Using the bipolar cautery, the mid portion of both tubes were cauterized for about a centimeter.  The attention was then turned to the pelvis.  Prominent vessels in the IP area noted bilaterally.; however, normal tubes and ovaries were noted bilaterally.  Posterior cul-de-sac without any lesions.  Marcaine 0.25% was  injected suprapubically.  A small incision was then made, 5 mm port was placed under direct visualization.  Using a probe, the rest of the pelvis was inspected.  Normal appendix was noted with some adhesions in the cecal area.  Using the bipolar cautery, the midportion of both tubes were cauterized for at least a centimeter in size with good cauterization being noted.  The procedure was terminated by removing the suprapubic port site under direct visualization.  Abdomen was then deflated and the infraumbilical site port was then  removed under direct visualization, making sure not to bring up any tissue into the incision.  After the abdomen was deflated, the fascia was identified along with the peritoneum and 0 Vicryl figure-of-eight suture was placed with good closure.  The skin was approximated with 4-0 Vicryl subcuticular closures and Dermabond was then placed. All instruments in the vagina was removed.  SPECIMEN:  None.  ESTIMATED BLOOD LOSS: 5 Ml( tenaculum site).  INTRAOPERATIVE FLUIDS:  600 mL.  DISPOSITION:  The patient tolerated the procedure well and was transferred to recovery room in stable condition.  HN/NUANCE  D:08/26/2020 T:08/26/2020 JOB:[id number]

## 2020-08-26 NOTE — Addendum Note (Signed)
Addended by: Karle Plumber B on: 08/26/2020 09:15 AM   Modules accepted: Orders

## 2020-08-28 ENCOUNTER — Encounter (HOSPITAL_BASED_OUTPATIENT_CLINIC_OR_DEPARTMENT_OTHER): Payer: Self-pay | Admitting: Obstetrics and Gynecology

## 2020-09-01 ENCOUNTER — Ambulatory Visit: Payer: BC Managed Care – PPO | Admitting: Pharmacist

## 2020-09-01 DIAGNOSIS — F902 Attention-deficit hyperactivity disorder, combined type: Secondary | ICD-10-CM | POA: Diagnosis not present

## 2020-09-01 DIAGNOSIS — F338 Other recurrent depressive disorders: Secondary | ICD-10-CM | POA: Diagnosis not present

## 2020-09-01 DIAGNOSIS — Z79899 Other long term (current) drug therapy: Secondary | ICD-10-CM | POA: Diagnosis not present

## 2020-09-01 DIAGNOSIS — F4010 Social phobia, unspecified: Secondary | ICD-10-CM | POA: Diagnosis not present

## 2020-09-04 NOTE — Addendum Note (Signed)
Addendum  created 09/04/20 1051 by Barnet Glasgow, MD   Intraprocedure Staff edited

## 2020-09-08 ENCOUNTER — Other Ambulatory Visit: Payer: Self-pay

## 2020-09-08 ENCOUNTER — Ambulatory Visit: Payer: BC Managed Care – PPO | Attending: Internal Medicine | Admitting: Pharmacist

## 2020-09-08 ENCOUNTER — Encounter: Payer: Self-pay | Admitting: Pharmacist

## 2020-09-08 VITALS — BP 130/85

## 2020-09-08 DIAGNOSIS — I10 Essential (primary) hypertension: Secondary | ICD-10-CM

## 2020-09-08 MED ORDER — AMLODIPINE BESYLATE 5 MG PO TABS: 5.0000 mg | ORAL_TABLET | Freq: Every day | ORAL | 1 refills | Status: DC

## 2020-09-08 NOTE — Progress Notes (Signed)
   S:    PCP: Dr. Wynetta Emery  Patient arrives in good spirits. Presents to the clinic for hypertension evaluation, counseling, and management. Patient was referred and last seen by Primary Care Provider on 08/01/2020. BP was elevated that that appt - Dr. Wynetta Emery would like to add low dose amlodipine today if BP remains above goal.   Medication adherence reported.  Current BP Medications include:  Propranolol ER 160 mg daily   Antihypertensives tried in the past include: none   Dietary habits include: compliant with salt restriction; drinks caffeine daily - amount varies Exercise habits include: none  Family / Social history:  - Fhx: no pertinent positives  - Tobacco: never smoker  - Alcohol: denies use    O:  Vitals:   09/08/20 1525  BP: 130/85    Home BP readings:  - SBPs: 131 - 138  - DBPs: 88 - 89  Last 3 Office BP readings: BP Readings from Last 3 Encounters:  09/08/20 130/85  08/25/20 127/81  08/23/20 135/85    BMET    Component Value Date/Time   NA 137 08/23/2020 1043   NA 141 08/01/2020 1021   K 3.8 08/23/2020 1043   CL 103 08/23/2020 1043   CO2 27 08/23/2020 1043   GLUCOSE 96 08/23/2020 1043   BUN 7 08/23/2020 1043   BUN 10 08/01/2020 1021   CREATININE 1.00 08/23/2020 1043   CALCIUM 9.0 08/23/2020 1043   GFRNONAA >60 08/23/2020 1043   GFRAA 87 08/01/2020 1021    Renal function: CrCl cannot be calculated (Unknown ideal weight.).  Clinical ASCVD: No  The ASCVD Risk score Mikey Bussing DC Jr., et al., 2013) failed to calculate for the following reasons:   The 2013 ASCVD risk score is only valid for ages 50 to 86  A/P: Hypertension longstanding currently above goal on current medications. BP Goal = < 130/80 mmHg. Medication adherence reported with propranolol. Will add low dose amlodipine.   -Started amlodipine 5 mg daily.  -Counseled on lifestyle modifications for blood pressure control including reduced dietary sodium, increased exercise, adequate  sleep.  Results reviewed and written information provided.   Total time in face-to-face counseling 30 minutes.   F/U Clinic Visit in 1 month.    Benard Halsted, PharmD, Para [place], [place] [phone]

## 2020-09-11 DIAGNOSIS — Z124 Encounter for screening for malignant neoplasm of cervix: Secondary | ICD-10-CM | POA: Diagnosis not present

## 2020-09-11 DIAGNOSIS — Z09 Encounter for follow-up examination after completed treatment for conditions other than malignant neoplasm: Secondary | ICD-10-CM | POA: Diagnosis not present

## 2020-09-21 DIAGNOSIS — Z20822 Contact with and (suspected) exposure to covid-19: Secondary | ICD-10-CM | POA: Diagnosis not present

## 2020-09-25 ENCOUNTER — Ambulatory Visit (INDEPENDENT_AMBULATORY_CARE_PROVIDER_SITE_OTHER): Payer: BC Managed Care – PPO | Admitting: Endocrinology

## 2020-09-25 ENCOUNTER — Other Ambulatory Visit: Payer: Self-pay

## 2020-09-25 ENCOUNTER — Encounter: Payer: Self-pay | Admitting: Endocrinology

## 2020-09-25 DIAGNOSIS — E042 Nontoxic multinodular goiter: Secondary | ICD-10-CM | POA: Diagnosis not present

## 2020-09-25 NOTE — Patient Instructions (Addendum)
Let's check the biopsy, guided by the ultrasound.  you will receive a phone call, about a day and time for an appointment. If no cancer is found, please come back for a follow-up appointment in 6 months.

## 2020-09-25 NOTE — Progress Notes (Signed)
Subjective:    Patient ID: Evelyn Lawson, female    DOB: Nov 02, 1980, 40 y.o.   MRN: [id number]  HPI Pt is referred by Dr Laural Benes, for nodular thyroid.  Pt was noted to have a thyroid nodule in approx 2018.  she has no h/o XRT or surgery to the neck.   Past Medical History:  Diagnosis Date  . ADHD (attention deficit hyperactivity disorder)   . Enlarged thyroid    ultasound in epic 08-07-2020  bilateral nodules   (08-16-2020 per pt pcp has referred her to an endocrinologist)  . Hemorrhoids   . History of seizures as a child    last febrile seizure age 34  . Hypertension    followed by pcp  . Social anxiety disorder   . Wears contact lenses     Past Surgical History:  Procedure Laterality Date  . BREAST ENHANCEMENT SURGERY  11/30/2008  . HEMORRHOID SURGERY  2013  . LAPAROSCOPIC TUBAL LIGATION Bilateral 08/25/2020   Procedure: LAPAROSCOPIC TUBAL LIGATION WITH BIPOLAR CAUTERY;  Surgeon: Maxie Better, MD;  Location: [facility];  Service: Gynecology;  Laterality: Bilateral;    Social History   Socioeconomic History  . Marital status: Single    Spouse name: Not on file  . Number of children: 1  . Years of education: Not on file  . Highest education level: Bachelor's degree (e.g., BA, AB, BS)  Occupational History  . Occupation: HR specialist  Tobacco Use  . Smoking status: Never Smoker  . Smokeless tobacco: Never Used  Vaping Use  . Vaping Use: Never used  Substance and Sexual Activity  . Alcohol use: Yes    Comment: occasional  . Drug use: Never  . Sexual activity: Not on file  Other Topics Concern  . Not on file  Social History Narrative  . Not on file   Social Determinants of Health   Financial Resource Strain: Not on file  Food Insecurity: Not on file  Transportation Needs: Not on file  Physical Activity: Not on file  Stress: Not on file  Social Connections: Not on file  Intimate Partner Violence: Not on file    Current Outpatient  Medications on File Prior to Visit  Medication Sig Dispense Refill  . acetaminophen (TYLENOL) 325 MG tablet Take 650 mg by mouth every 6 (six) hours as needed (for pain.).    Marland Kitchen amLODipine (NORVASC) 5 MG tablet Take 1 tablet (5 mg total) by mouth daily. 90 tablet 1  . amphetamine-dextroamphetamine (ADDERALL) 10 MG tablet Take 10 mg by mouth daily.    . Methylphenidate HCl ER, PM, (JORNAY PM) 60 MG CP24 Take 60 mg by mouth at bedtime.     . Multiple Vitamin (MULTIVITAMIN WITH MINERALS) TABS tablet Take 1 tablet by mouth daily. Women's Multivitamin    . propranolol ER (INDERAL LA) 160 MG SR capsule Take 1 capsule (160 mg total) by mouth daily. 30 capsule 6  . ibuprofen (ADVIL) 800 MG tablet Take 1 tablet (800 mg total) by mouth every 8 (eight) hours as needed. (Patient not taking: Reported on 09/25/2020) 30 tablet 0   No current facility-administered medications on file prior to visit.    Allergies  Allergen Reactions  . Other Swelling and Rash    Peppers   . Tomato Swelling and Rash    Paste is fine     Family History  Problem Relation Age of Onset  . Cancer Maternal Grandmother        breast  .  Breast cancer Maternal Grandmother   . Cancer Paternal Grandmother        breast  . Breast cancer Paternal Grandmother   . Cancer Paternal Grandfather        colon  . Diabetes Maternal Grandfather   . Breast cancer Maternal Aunt   . Thyroid disease Neg Hx     BP 110/68   Pulse 74   Ht 5\' 6"  (1.676 m)   Wt 160 lb (72.6 kg)   SpO2 98%   BMI 25.82 kg/m    Review of Systems Denies hoarseness, neck pain, sob, dysphagia, and flushing.       Objective:   Physical Exam VITAL SIGNS:  See vs page GENERAL: no distress NECK: I am unable to appreciate the nodule  No palpable lymphadenopathy at the anterior neck.     : Dominant right nodule measuring 2.2 cm--meets criteria for biopsy.  Additional small nodules that do not meet criteria for follow-up.    Lab Results  Component Value  Date   TSH 0.965 08/01/2020   I have reviewed outside records, and summarized: Pt was noted to have thyroid nodule, and referred here.  She was seen as new pt.  Multiple other probs were addressed, and goiter was noted on exam.      Assessment & Plan:  MNG, new to me.  We discussed risk and approach to bx.  She agrees.    Patient Instructions  Let's check the biopsy, guided by the ultrasound.  you will receive a phone call, about a day and time for an appointment. If no cancer is found, please come back for a follow-up appointment in 6 months.

## 2020-10-03 DIAGNOSIS — Z20822 Contact with and (suspected) exposure to covid-19: Secondary | ICD-10-CM | POA: Diagnosis not present

## 2020-10-07 DIAGNOSIS — Z20822 Contact with and (suspected) exposure to covid-19: Secondary | ICD-10-CM | POA: Diagnosis not present

## 2020-10-09 ENCOUNTER — Ambulatory Visit: Payer: BC Managed Care – PPO | Admitting: Pharmacist

## 2020-10-12 ENCOUNTER — Ambulatory Visit: Payer: BC Managed Care – PPO | Admitting: Pharmacist

## 2020-10-13 ENCOUNTER — Ambulatory Visit: Payer: BC Managed Care – PPO | Admitting: Pharmacist

## 2020-11-20 ENCOUNTER — Ambulatory Visit: Payer: BC Managed Care – PPO | Admitting: Pharmacist

## 2020-11-30 ENCOUNTER — Other Ambulatory Visit: Payer: Self-pay

## 2020-11-30 ENCOUNTER — Ambulatory Visit: Payer: BC Managed Care – PPO | Attending: Internal Medicine | Admitting: Internal Medicine

## 2020-11-30 DIAGNOSIS — E782 Mixed hyperlipidemia: Secondary | ICD-10-CM

## 2020-11-30 DIAGNOSIS — E559 Vitamin D deficiency, unspecified: Secondary | ICD-10-CM

## 2020-11-30 DIAGNOSIS — I10 Essential (primary) hypertension: Secondary | ICD-10-CM

## 2020-11-30 DIAGNOSIS — Z6379 Other stressful life events affecting family and household: Secondary | ICD-10-CM | POA: Insufficient documentation

## 2020-11-30 DIAGNOSIS — E041 Nontoxic single thyroid nodule: Secondary | ICD-10-CM | POA: Diagnosis not present

## 2020-11-30 NOTE — Progress Notes (Signed)
Virtual Visit via Telephone Note  I connected with Evelyn Lawson on 11/30/20 at 3:32 p.m by telephone and verified that I am speaking with the correct person using two identifiers.  Location: Patient: work Provider: office   I discussed the limitations, risks, security and privacy concerns of performing an evaluation and management service by telephone and the availability of in person appointments. I also discussed with the patient that there may be a patient responsible charge related to this service. The patient expressed understanding and agreed to proceed.   History of Present Illness:  RT thyroid nodule/MNG: Thyroid ultrasound revealed large nodule on the right thyroid gland.  Radiologist recommended biopsy given the size of it to make sure that it was not cancerous.  She had some small nodules on the left gland that did not meet criteria for biopsy.  Had mild elev of free T4.  I had her see the endocrinologist Dr. Loanne Drilling first for opoinion.  Patient tells me she was told it was optional and so she had decided to put off on doing it and the plan was to follow-up with him again in 6 months.  However I have reviewed Dr. Cordelia Pen note, and it seems that he did speak with her about it and recommended having the biopsy done and it was to be set up by his office.  Patient states she probably misunderstood and thought it was optional.  Vit D: level was 23.9.  I recommended taking Vit D 400 IU OTC.  She is taking a MV gummy every day.   Cholesterol level was elev with LDL of 114 and total cholesterol of 215.  Doing better with eating habits.  Not getting in as much exercise as she should but plans to start walking with her kids now that weather warming up.  HTN: checks BP 2 x a wk.  Range 130s/80s.  Compliant with Propranolol and Norvasc.  She has a follow-up appointment with our clinical pharmacist next week for recheck of blood pressure. Feels anxiety planning a role with her blood pressure as  she has a lot going on.  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  Her mom lives with her and she assists in her care.  Patient also has kids and other life issues including bills.  Observations/Objective: Results for orders placed or performed during the hospital encounter of 08/25/20  CBC  Result Value Ref Range   WBC 7.4 4.0 - 10.5 K/uL   RBC 4.17 3.87 - 5.11 MIL/uL   Hemoglobin 13.3 12.0 - 15.0 g/dL   HCT 43.4 36.0 - 46.0 %   MCV 104.1 (H) 80.0 - 100.0 fL   MCH 31.9 26.0 - 34.0 pg   MCHC 30.6 30.0 - 36.0 g/dL   RDW 12.9 11.5 - 15.5 %   Platelets 235 150 - 400 K/uL   nRBC 0.0 0.0 - 0.2 %  CBC  Result Value Ref Range   WBC 5.8 4.0 - 10.5 K/uL   RBC 4.79 3.87 - 5.11 MIL/uL   Hemoglobin 15.4 (H) 12.0 - 15.0 g/dL   HCT 47.8 (H) 36.0 - 46.0 %   MCV 99.8 80.0 - 100.0 fL   MCH 32.2 26.0 - 34.0 pg   MCHC 32.2 30.0 - 36.0 g/dL   RDW 12.7 11.5 - 15.5 %   Platelets 282 150 - 400 K/uL   nRBC 0.0  0.0 - 0.2 %  Pregnancy, urine POC  Result Value Ref Range   Preg Test, Ur NEGATIVE NEGATIVE  Type and screen [facility] [place] [place]  Result Value Ref Range   ABO/RH(D) O NEG    Antibody Screen NEG    Sample Expiration      08/28/2020,2359 Performed at [facility], [place] [address] [place], [place] [address]      Assessment and Plan:  1. Essential hypertension Reported home blood pressure readings not at goal of 130/80 or lower.  She will bring in her readings with her next week to see the clinical pharmacist.  In the meantime she will continue propranolol and amlodipine.  2. Thyroid nodule Advised patient that based on the note from the endocrinologist, he to recommended biopsy of the predominant nodule and his office was to set it up.  Informed her that I think it is the wise thing to do to make sure it is not a cancerous nodule.  Patient states that she will call the  endocrinologist office to get it set up. - Ambulatory referral to Endocrinology  3. Mixed hyperlipidemia Discussed the importance of healthy eating habits and regular exercise.  Encouraged her to get in some aerobic exercise several times a week.  She plans to start doing so now that the weather is warming up  4. Vitamin D deficiency Advised to look on the bottle of the multivitamin that she is taking and make sure that it has about 400 to 1000 IU of vitamin D which should be sufficient for her deficiency.  5. Stressful life event affecting family Discussed ways to help reduce stress including meditation, taking 10 minutes out of the day to meditate and do deep breathing exercises, listening to music, exercising.  Inform her that we can also refer her to a therapist if she feels that would help.  Patient was agreeable to referral to behavioral health. - Ambulatory referral to Psychiatry  Follow Up Instructions: 4 months   I discussed the assessment and treatment plan with the patient. The patient was provided an opportunity to ask questions and all were answered. The patient agreed with the plan and demonstrated an understanding of the instructions.   The patient was advised to call back or seek an in-person evaluation if the symptoms worsen or if the condition fails to improve as anticipated.  I spent 19 minutes of non-face-to-face time talking with pt during this encounter.   Karle Plumber, MD

## 2020-12-01 ENCOUNTER — Telehealth: Payer: Self-pay | Admitting: Internal Medicine

## 2020-12-01 NOTE — Telephone Encounter (Signed)
Called patient, no answer, unable to LVM. Was calling to schedule patient a 4 month f/u with Dr. Wynetta Emery (around 04/01/21).

## 2020-12-04 ENCOUNTER — Encounter: Payer: Self-pay | Admitting: Pharmacist

## 2020-12-04 ENCOUNTER — Ambulatory Visit: Payer: BC Managed Care – PPO | Attending: Internal Medicine | Admitting: Pharmacist

## 2020-12-04 ENCOUNTER — Telehealth: Payer: Self-pay | Admitting: Internal Medicine

## 2020-12-04 ENCOUNTER — Other Ambulatory Visit: Payer: Self-pay

## 2020-12-04 VITALS — BP 141/87

## 2020-12-04 DIAGNOSIS — I10 Essential (primary) hypertension: Secondary | ICD-10-CM | POA: Diagnosis not present

## 2020-12-04 NOTE — Progress Notes (Signed)
   S:    PCP: Dr. Wynetta Emery  Patient arrives in good spirits. Presents to the clinic for hypertension evaluation, counseling, and management. Patient was referred and last seen by Primary Care Provider on 12/01/2019.   Medication adherence reported. Patient denies chest pain, dyspnea, HA or blurred vision. Of note, patient has a test coming up for school tomorrow and endorses increased anxiety today.    Current BP Medications include:  Amlodipine 5 mg daily, propranolol ER 160 mg daily   Antihypertensives tried in the past include: none   Dietary habits include: compliant with salt restriction; drinks caffeine daily - amount varies Exercise habits include: none  Family / Social history:  - Fhx: no pertinent positives  - Tobacco: never smoker  - Alcohol: denies use   O:  Vitals:   12/04/20 1127  BP: (!) 141/87   Home BP readings:  - SBPs: 131 - 138  - DBPs: 88 - 89  Last 3 Office BP readings: BP Readings from Last 3 Encounters:  12/04/20 (!) 141/87  09/25/20 110/68  09/08/20 130/85   BMET    Component Value Date/Time   NA 137 08/23/2020 1043   NA 141 08/01/2020 1021   K 3.8 08/23/2020 1043   CL 103 08/23/2020 1043   CO2 27 08/23/2020 1043   GLUCOSE 96 08/23/2020 1043   BUN 7 08/23/2020 1043   BUN 10 08/01/2020 1021   CREATININE 1.00 08/23/2020 1043   CALCIUM 9.0 08/23/2020 1043   GFRNONAA >60 08/23/2020 1043   GFRAA 87 08/01/2020 1021    Renal function: CrCl cannot be calculated (Patient's most recent lab result is older than the maximum 21 days allowed.).  Clinical ASCVD: No  The ASCVD Risk score Mikey Bussing DC Jr., et al., 2013) failed to calculate for the following reasons:   The 2013 ASCVD risk score is only valid for ages 44 to 15  A/P: Hypertension longstanding currently above goal on current medications, however, this is likely due to anxiety. BP Goal = < 130/80 mmHg. Medication adherence reported with propranolol and amlodipine. No changes today - will have  her return in 1 month.     -Continued current regimen.  -Counseled on lifestyle modifications for blood pressure control including reduced dietary sodium, increased exercise, adequate sleep.  Results reviewed and written information provided.   Total time in face-to-face counseling 30 minutes.   F/U Clinic Visit in 1 month.    Benard Halsted, PharmD, Para March, [place] [phone]          Home ranges: 120-130s Compliant No dietary or exercise changes  Big test tomorrow; no changes

## 2020-12-04 NOTE — Telephone Encounter (Signed)
I return Pt call, unable to LVM since VM is full 

## 2020-12-04 NOTE — Telephone Encounter (Signed)
Patient called today to reschedule her appt. For today.  She is not feeling well and would like to reschedule.  Please call to make another appt.  CB# [phone]

## 2020-12-07 ENCOUNTER — Encounter: Payer: Self-pay | Admitting: Internal Medicine

## 2020-12-20 DIAGNOSIS — Z79899 Other long term (current) drug therapy: Secondary | ICD-10-CM | POA: Diagnosis not present

## 2020-12-20 DIAGNOSIS — F900 Attention-deficit hyperactivity disorder, predominantly inattentive type: Secondary | ICD-10-CM | POA: Diagnosis not present

## 2020-12-20 DIAGNOSIS — F902 Attention-deficit hyperactivity disorder, combined type: Secondary | ICD-10-CM | POA: Diagnosis not present

## 2020-12-30 ENCOUNTER — Encounter: Payer: Self-pay | Admitting: Internal Medicine

## 2021-01-05 ENCOUNTER — Other Ambulatory Visit (HOSPITAL_COMMUNITY)
Admission: RE | Admit: 2021-01-05 | Discharge: 2021-01-05 | Disposition: A | Discharge status: Home / Self Care | Payer: BC Managed Care – PPO | Source: Ambulatory Visit | Attending: Physician Assistant | Admitting: Physician Assistant

## 2021-01-05 ENCOUNTER — Other Ambulatory Visit: Payer: Self-pay

## 2021-01-05 ENCOUNTER — Ambulatory Visit
Admission: RE | Admit: 2021-01-05 | Discharge: 2021-01-05 | Disposition: A | Discharge status: Home / Self Care | Payer: BC Managed Care – PPO | Source: Ambulatory Visit | Attending: Endocrinology | Admitting: Endocrinology

## 2021-01-05 DIAGNOSIS — E042 Nontoxic multinodular goiter: Secondary | ICD-10-CM

## 2021-01-05 DIAGNOSIS — E041 Nontoxic single thyroid nodule: Secondary | ICD-10-CM | POA: Diagnosis not present

## 2021-01-05 DIAGNOSIS — D440 Neoplasm of uncertain behavior of thyroid gland: Secondary | ICD-10-CM | POA: Diagnosis not present

## 2021-01-08 LAB — CYTOLOGY - NON PAP

## 2021-01-09 ENCOUNTER — Encounter: Payer: Self-pay | Admitting: Endocrinology

## 2021-01-09 ENCOUNTER — Ambulatory Visit: Payer: BC Managed Care – PPO | Attending: Internal Medicine | Admitting: Pharmacist

## 2021-01-09 ENCOUNTER — Encounter: Payer: Self-pay | Admitting: Pharmacist

## 2021-01-09 ENCOUNTER — Other Ambulatory Visit: Payer: Self-pay

## 2021-01-09 VITALS — BP 127/88

## 2021-01-09 DIAGNOSIS — I10 Essential (primary) hypertension: Secondary | ICD-10-CM

## 2021-01-09 NOTE — Progress Notes (Signed)
   S:    PCP: Dr. Wynetta Emery  Patient arrives in good spirits. Presents to the clinic for hypertension evaluation, counseling, and management. Patient was referred and last seen by Primary Care Provider on 11/30/2020. I saw her on 12/04/2020 but made no changes as she was anxious at that visit.   Medication adherence reported. Patient denies chest pain, dyspnea, HA or blurred vision.   Current BP Medications include:  Amlodipine 5 mg daily, propranolol ER 160 mg daily   Antihypertensives tried in the past include: none   Dietary habits include: compliant with salt restriction; drinks caffeine daily - amount varies Exercise habits include: none  Family / Social history:  - Fhx: no pertinent positives  - Tobacco: never smoker  - Alcohol: denies use   O:  Vitals:   01/09/21 1037  BP: 127/88    Home BP readings:  - Has checked occasionally since last visit - Reports that BP readings have been okay since I saw her last month but most of her DBP values have been >80.   Last 3 Office BP readings: BP Readings from Last 3 Encounters:  01/09/21 127/88  12/04/20 (!) 141/87  09/25/20 110/68   BMET    Component Value Date/Time   NA 137 08/23/2020 1043   NA 141 08/01/2020 1021   K 3.8 08/23/2020 1043   CL 103 08/23/2020 1043   CO2 27 08/23/2020 1043   GLUCOSE 96 08/23/2020 1043   BUN 7 08/23/2020 1043   BUN 10 08/01/2020 1021   CREATININE 1.00 08/23/2020 1043   CALCIUM 9.0 08/23/2020 1043   GFRNONAA >60 08/23/2020 1043   GFRAA 87 08/01/2020 1021    Renal function: CrCl cannot be calculated (Patient's most recent lab result is older than the maximum 21 days allowed.).  Clinical ASCVD: No  The ASCVD Risk score Mikey Bussing DC Jr., et al., 2013) failed to calculate for the following reasons:   The 2013 ASCVD risk score is only valid for ages 26 to 13  A/P: Hypertension longstanding. BP Goal = < 130/80 mmHg. Medication adherence reported with propranolol and amlodipine. Her DBP is above  goal. She is amenable to taking two amlodipine tablets daily (total dose of 10mg ) and returning in 2 weeks. -Increase amlodipine to 10 mg daily. -Counseled on lifestyle modifications for blood pressure control including reduced dietary sodium, increased exercise, adequate sleep.  Results reviewed and written information provided.   Total time in face-to-face counseling 30 minutes.   F/U Clinic Visit in 2 weeks.    Benard Halsted, PharmD, Para March, [place] [phone]          Home ranges: 120-130s Compliant No dietary or exercise changes  Big test tomorrow; no changes

## 2021-01-15 ENCOUNTER — Other Ambulatory Visit: Payer: Self-pay | Admitting: Endocrinology

## 2021-01-15 DIAGNOSIS — E042 Nontoxic multinodular goiter: Secondary | ICD-10-CM

## 2021-01-22 ENCOUNTER — Other Ambulatory Visit: Payer: Self-pay | Admitting: Internal Medicine

## 2021-01-22 NOTE — Telephone Encounter (Signed)
   Notes to clinic:  Patient has appointment tomorrow to follow up on blood pressure    Requested Prescriptions  Pending Prescriptions Disp Refills   amLODipine (NORVASC) 5 MG tablet [Pharmacy Med Name: AMLODIPINE BESYLATE 5 MG TAB] 90 tablet 1    Sig: Take 1 tablet (5 mg total) by mouth daily.      Cardiovascular:  Calcium Channel Blockers Passed - 01/22/2021 10:12 AM      Passed - Last BP in normal range    BP Readings from Last 1 Encounters:  01/09/21 127/88          Passed - Valid encounter within last 6 months    Recent Outpatient Visits           1 week ago Essential hypertension   [place], Jarome Matin, RPH-CPP   1 month ago Essential hypertension   [place], Jarome Matin, RPH-CPP   1 month ago Essential hypertension   [place], MD   4 months ago Essential hypertension   [place], Stephen L, RPH-CPP   5 months ago Need for Tdap vaccination   [place], RPH-CPP       Future Appointments             Tomorrow [place], Jarome Matin, [place]

## 2021-01-23 ENCOUNTER — Ambulatory Visit: Admission: RE | Admit: 2021-01-23 | Payer: BC Managed Care – PPO | Source: Ambulatory Visit

## 2021-01-23 ENCOUNTER — Other Ambulatory Visit: Payer: Self-pay

## 2021-01-23 ENCOUNTER — Ambulatory Visit: Payer: BC Managed Care – PPO | Attending: Internal Medicine | Admitting: Pharmacist

## 2021-01-23 ENCOUNTER — Encounter: Payer: Self-pay | Admitting: Pharmacist

## 2021-01-23 VITALS — BP 122/83 | HR 66

## 2021-01-23 DIAGNOSIS — I10 Essential (primary) hypertension: Secondary | ICD-10-CM

## 2021-01-23 MED ORDER — AMLODIPINE BESYLATE 10 MG PO TABS: 10.0000 mg | ORAL_TABLET | Freq: Every day | ORAL | 1 refills | Status: DC

## 2021-01-23 NOTE — Progress Notes (Signed)
   S:    PCP: Dr. Wynetta Emery  Patient arrives in good spirits. Presents to the clinic for hypertension evaluation, counseling, and management. Patient was referred and last seen by Primary Care Provider on 11/30/2020. Last seen by clinical pharmacist on 01/09/2021 - BP remained slightly above goal and amlodipine was increased to 10 mg daily.   Medication adherence reported, denies any missed doses. Patient denies chest pain, dyspnea or blurred vision. Does report some HA, but feels these are related to allergies. Patient has taken medications already this AM.   Current BP Medications include:  Amlodipine 10 mg daily, propranolol ER 160 mg daily   Antihypertensives tried in the past include: none   Dietary habits include: compliant with salt restriction; drinks caffeine daily - amount varies Exercise habits include: none  Family / Social history:  - Fhx: no pertinent positives  - Tobacco: never smoker  - Alcohol: denies use   O:  Vitals:   01/23/21 1210  BP: 122/83  Pulse: 66    Home BP readings:  - Has not checked recently   Last 3 Office BP readings: BP Readings from Last 3 Encounters:  01/23/21 122/83  01/09/21 127/88  12/04/20 (!) 141/87   BMET    Component Value Date/Time   NA 137 08/23/2020 1043   NA 141 08/01/2020 1021   K 3.8 08/23/2020 1043   CL 103 08/23/2020 1043   CO2 27 08/23/2020 1043   GLUCOSE 96 08/23/2020 1043   BUN 7 08/23/2020 1043   BUN 10 08/01/2020 1021   CREATININE 1.00 08/23/2020 1043   CALCIUM 9.0 08/23/2020 1043   GFRNONAA >60 08/23/2020 1043   GFRAA 87 08/01/2020 1021    Renal function: CrCl cannot be calculated (Patient's most recent lab result is older than the maximum 21 days allowed.).  Clinical ASCVD: No  The ASCVD Risk score Mikey Bussing DC Jr., et al., 2013) failed to calculate for the following reasons:   The 2013 ASCVD risk score is only valid for ages 31 to 18  A/P: Hypertension longstanding. BP Goal = < 130/80 mmHg. Medication  adherence reported with propranolol and amlodipine. BP is very close to goal, with DBP remaining slightly above goal. Overall pleased with BP improvement and will continue with current regimen at this time.  -Continued current blood pressure medications.  -Counseled on lifestyle modifications for blood pressure control including reduced dietary sodium, increased exercise, adequate sleep.  Results reviewed and written information provided.   Total time in face-to-face counseling 30 minutes.   F/U with PCP on March 02, 2021.    Harriet Pho, PharmD PGY-1 [facility] Pharmacy Resident   01/23/2021 12:53 PM

## 2021-01-25 DIAGNOSIS — Z20822 Contact with and (suspected) exposure to covid-19: Secondary | ICD-10-CM | POA: Diagnosis not present

## 2021-02-21 ENCOUNTER — Ambulatory Visit
Admission: RE | Admit: 2021-02-21 | Discharge: 2021-02-21 | Disposition: A | Discharge status: Home / Self Care | Payer: BC Managed Care – PPO | Source: Ambulatory Visit | Attending: Endocrinology | Admitting: Endocrinology

## 2021-02-21 ENCOUNTER — Other Ambulatory Visit (HOSPITAL_COMMUNITY)
Admission: RE | Admit: 2021-02-21 | Discharge: 2021-02-21 | Disposition: A | Discharge status: Home / Self Care | Payer: BC Managed Care – PPO | Source: Ambulatory Visit | Attending: Student | Admitting: Student

## 2021-02-21 DIAGNOSIS — E041 Nontoxic single thyroid nodule: Secondary | ICD-10-CM | POA: Diagnosis not present

## 2021-02-21 DIAGNOSIS — D34 Benign neoplasm of thyroid gland: Secondary | ICD-10-CM | POA: Diagnosis not present

## 2021-02-21 DIAGNOSIS — E042 Nontoxic multinodular goiter: Secondary | ICD-10-CM | POA: Diagnosis not present

## 2021-02-23 LAB — CYTOLOGY - NON PAP

## 2021-03-02 ENCOUNTER — Encounter: Payer: Self-pay | Admitting: Internal Medicine

## 2021-03-02 ENCOUNTER — Ambulatory Visit: Payer: BC Managed Care – PPO | Attending: Internal Medicine | Admitting: Internal Medicine

## 2021-03-02 ENCOUNTER — Other Ambulatory Visit: Payer: Self-pay

## 2021-03-02 ENCOUNTER — Other Ambulatory Visit: Payer: Self-pay | Admitting: Internal Medicine

## 2021-03-02 VITALS — BP 110/80 | HR 81 | Resp 16 | Wt 156.0 lb

## 2021-03-02 DIAGNOSIS — N943 Premenstrual tension syndrome: Secondary | ICD-10-CM

## 2021-03-02 DIAGNOSIS — E042 Nontoxic multinodular goiter: Secondary | ICD-10-CM

## 2021-03-02 DIAGNOSIS — I10 Essential (primary) hypertension: Secondary | ICD-10-CM | POA: Diagnosis not present

## 2021-03-02 DIAGNOSIS — F320 Major depressive disorder, single episode, mild: Secondary | ICD-10-CM | POA: Diagnosis not present

## 2021-03-02 MED ORDER — SERTRALINE HCL 50 MG PO TABS: ORAL_TABLET | ORAL | 3 refills | Status: DC

## 2021-03-02 NOTE — Patient Instructions (Signed)
Premenstrual Syndrome Premenstrual syndrome (PMS) is a group of physical, emotional, and behavioral symptoms that affect women as part of their menstrual cycle. PMS occurs 1-2 weeks before the start of a woman's menstrual period and goes away a few daysafter menstrual bleeding begins. PMS can range from mild to severe. What are the causes? The exact cause of this condition is not known, but it seems to be related tohormone changes that happen before menstruation. What are the signs or symptoms? Symptoms of this condition often happen every month. They go away after your period starts. Physical symptoms of this condition include: Bloating. Breast pain or tenderness. Headaches. Extreme fatigue. Backaches. Swelling of the hands and feet. Weight gain. Hot flashes. Emotional symptoms of this condition include: Mood swings. Depression. Angry or hostile outbursts. Irritability. Anxiety. Crying spells. Behavioral symptoms include: Food cravings or appetite changes. Changes in sexual desire. Confusion. Social withdrawal. Poor concentration. How is this diagnosed? This condition may be diagnosed based on a history of your symptoms. This condition is generally diagnosed if symptoms of PMS: Are present in the 5 days before your period starts. End within 4 days after your period starts. Happen at least 3 months in a row. Interfere with some of your normal activities. Other conditions that can cause some of these symptoms must be ruled out before PMS can be diagnosed. These include depression, anxiety, anemia, and thyroidproblems. How is this treated? This condition may be treated by doing the following: Maintaining a healthy lifestyle. This includes eating a well-balanced diet and exercising regularly. Taking over-the-counter medicines that can help relieve symptoms, such as cramps, aches, pain, headaches, and breast tenderness. Follow these instructions at home: Eating and drinking  Eat  a well-balanced diet. Avoid caffeine and alcohol. Limit the amount of salt and salty foods you eat. This will help reduce bloating. Drink enough fluid to keep your urine pale yellow. Take a multivitamin if told to do so by your health care provider.  Lifestyle  Do not use any products that contain nicotine or tobacco. These products include cigarettes, chewing tobacco, and vaping devices, such as e-cigarettes. If you need help quitting, ask your health care provider. Exercise regularly as suggested by your health care provider. Get enough sleep. For most adults, this is 7-8 hours of sleep each night. Practice relaxation techniques, such as yoga, tai chi, or meditation. Find healthy ways to manage stress.  General instructions  For 2-3 months, write down your symptoms, whether they are mild to severe, and how long they last. This will help your health care provider choose the best treatment for you. Take over-the-counter and prescription medicines only as told by your health care provider. If you are using birth control pills (oral contraceptives), use them as told by your health care provider.  Contact a health care provider if: Your symptoms get worse. You develop new symptoms. You have trouble doing your daily activities. Summary Premenstrual syndrome (PMS) is a group of physical, emotional, and behavioral symptoms that affect women as part of their menstrual cycle. PMS starts 1-2 weeks before the start of a woman's period and goes away a few days after the period starts. PMS is treated by maintaining a healthy lifestyle and taking medicines to relieve the symptoms. This information is not intended to replace advice given to you by your health care provider. Make sure you discuss any questions you have with your healthcare provider. Document Revised: 04/28/2020 Document Reviewed: 04/28/2020 Elsevier Patient Education  [place].

## 2021-03-02 NOTE — Progress Notes (Signed)
Patient ID: Evelyn Lawson, female    DOB: 12/25/1980  MRN: [id number]  CC: Hypertension   Subjective: Evelyn Lawson is a 40 y.o. female who presents for chronic ds management Her concerns today include:  Patient with history of HTN, vitamin D insufficiency, HL, MNG/right dominant nodule, ADHD (Dr. Rachel Moulds), migraines, mass RT breast (MMG through [facility] 06/2020.  Repeat in 6 mths)   HTN:  compliant with amlodipine and propranolol.  Not checking BP.  Limit salt in foods.  MNG:  had bx of the dominant right-sided nodule.  This revealed benign follicular changes.  Reports being told by Dr. Loanne Drilling that she has a 1% chance of this becoming cancerous over her lifetime.    No problems swallowing.  At night times she does notice a little shortness of breath sometimes and would have to lay on one side She wonders about her thyroid level and whether it needs to be checked.  We did check levels back in November.  She had normal TSH with slight elevation of free T4.  Positive depression screen today: Reports history of depression in the past.  Also has history of premenstrual syndrome.  She would have major issues with her boyfriend around the time of her cycles where they would get into arguments or she would break-up with him.  Used to take Zoloft 7 days leading up to her cycles that was prescribed by her gynecologist Dr. Garwin Brothers.  She found it helpful.  However she has been off of it for a while and would not mind getting back on it.  Plans to see therapist Maura Crandall, at [place] next week   Patient Active Problem List   Diagnosis Date Noted   Mixed hyperlipidemia 11/30/2020   Stressful life event affecting family 11/30/2020   Thyroid nodule 11/30/2020   Multinodular goiter 09/25/2020   Encounter for sterilization 08/17/2020   Essential hypertension 08/01/2020   Thyroid enlargement 08/01/2020   Vitamin D deficiency 08/01/2020   Influenza vaccination declined 08/01/2020    External hemorrhoids with irritation/pain 01/31/2014   Constipation, chronic 01/31/2014   Internal hemorrhoids with complication 63/87/5643     Current Outpatient Medications on File Prior to Visit  Medication Sig Dispense Refill   acetaminophen (TYLENOL) 325 MG tablet Take 650 mg by mouth every 6 (six) hours as needed (for pain.).     amLODipine (NORVASC) 10 MG tablet Take 1 tablet (10 mg total) by mouth daily. 90 tablet 1   amphetamine-dextroamphetamine (ADDERALL) 10 MG tablet Take 20 mg by mouth daily.     ibuprofen (ADVIL) 800 MG tablet Take 1 tablet (800 mg total) by mouth every 8 (eight) hours as needed. (Patient not taking: No sig reported) 30 tablet 0   Multiple Vitamin (MULTIVITAMIN WITH MINERALS) TABS tablet Take 1 tablet by mouth daily. Women's Multivitamin     propranolol ER (INDERAL LA) 160 MG SR capsule Take 1 capsule (160 mg total) by mouth daily. 30 capsule 6   No current facility-administered medications on file prior to visit.    Allergies  Allergen Reactions   Other Swelling and Rash    Peppers    Tomato Swelling and Rash    Paste is fine     Social History   Socioeconomic History   Marital status: Single    Spouse name: Not on file   Number of children: 1   Years of education: Not on file   Highest education level: Bachelor's degree (e.g., BA, AB, BS)  Occupational History   Occupation: HR specialist  Tobacco Use   Smoking status: Never   Smokeless tobacco: Never  Vaping Use   Vaping Use: Never used  Substance and Sexual Activity   Alcohol use: Yes    Comment: occasional   Drug use: Never   Sexual activity: Not on file  Other Topics Concern   Not on file  Social History Narrative   Not on file   Social Determinants of Health   Financial Resource Strain: Not on file  Food Insecurity: Not on file  Transportation Needs: Not on file  Physical Activity: Not on file  Stress: Not on file  Social Connections: Not on file  Intimate Partner  Violence: Not on file    Family History  Problem Relation Age of Onset   Cancer Maternal Grandmother        breast   Breast cancer Maternal Grandmother    Cancer Paternal Grandmother        breast   Breast cancer Paternal Grandmother    Cancer Paternal Grandfather        colon   Diabetes Maternal Grandfather    Breast cancer Maternal Aunt    Thyroid disease Neg Hx     Past Surgical History:  Procedure Laterality Date   BREAST ENHANCEMENT SURGERY  11/30/2008   HEMORRHOID SURGERY  2013   LAPAROSCOPIC TUBAL LIGATION Bilateral 08/25/2020   Procedure: LAPAROSCOPIC TUBAL LIGATION WITH BIPOLAR CAUTERY;  Surgeon: Servando Salina, MD;  Location: [facility];  Service: Gynecology;  Laterality: Bilateral;    ROS: Review of Systems Negative except as stated above  PHYSICAL EXAM: BP 110/80   Pulse 81   Resp 16   Wt 156 lb (70.8 kg)   SpO2 100%   BMI 25.18 kg/m   Physical Exam  General appearance - alert, well appearing, middle-aged African-American female and in no distress Mental status - normal mood, behavior, speech, dress, motor activity, and thought processes Neck -mild diffuse enlargement of the thyroid gland. Chest - clear to auscultation, no wheezes, rales or rhonchi, symmetric air entry Heart - normal rate, regular rhythm, normal S1, S2, no murmurs, rubs, clicks or gallops Extremities - peripheral pulses normal, no pedal edema, no clubbing or cyanosis  Depression screen [facility] 2/9 03/02/2021 08/01/2020  Decreased Interest 2 0  Down, Depressed, Hopeless 2 0  PHQ - 2 Score 4 0  Altered sleeping 3 -  Tired, decreased energy 2 -  Change in appetite 1 -  Feeling bad or failure about yourself  1 -  Trouble concentrating 2 -  Moving slowly or fidgety/restless 2 -  Suicidal thoughts 0 -  PHQ-9 Score 15 -    CMP Latest Ref Rng & Units 08/23/2020 08/01/2020 12/18/2015  Glucose 70 - 99 mg/dL 96 84 100(H)  BUN 6 - 20 mg/dL 7 10 8   Creatinine 0.44 - 1.00 mg/dL  1.00 0.96 0.97  Sodium 135 - 145 mmol/L 137 141 137  Potassium 3.5 - 5.1 mmol/L 3.8 4.1 3.5  Chloride 98 - 111 mmol/L 103 101 99(L)  CO2 22 - 32 mmol/L 27 24 29   Calcium 8.9 - 10.3 mg/dL 9.0 9.9 8.8(L)  Total Protein 6.0 - 8.5 g/dL - 7.5 7.8  Total Bilirubin 0.0 - 1.2 mg/dL - 0.8 0.5  Alkaline Phos 44 - 121 IU/L - 60 39  AST 0 - 40 IU/L - 11 24  ALT 0 - 32 IU/L - 14 17   Lipid Panel  Component Value Date/Time   CHOL 215 (H) 08/01/2020 1021   TRIG 60 08/01/2020 1021   HDL 90 08/01/2020 1021   CHOLHDL 2.4 08/01/2020 1021   LDLCALC 114 (H) 08/01/2020 1021    CBC    Component Value Date/Time   WBC 5.8 08/25/2020 1222   RBC 4.79 08/25/2020 1222   HGB 15.4 (H) 08/25/2020 1222   HGB 13.8 08/01/2020 1021   HCT 47.8 (H) 08/25/2020 1222   HCT 41.2 08/01/2020 1021   PLT 282 08/25/2020 1222   PLT 262 08/01/2020 1021   MCV 99.8 08/25/2020 1222   MCV 95 08/01/2020 1021   MCH 32.2 08/25/2020 1222   MCHC 32.2 08/25/2020 1222   RDW 12.7 08/25/2020 1222   RDW 11.3 (L) 08/01/2020 1021    ASSESSMENT AND PLAN: 1. Essential hypertension At goal.  Continue current medications and low-salt diet  2. Multinodular goiter She does have intermittent compressive symptoms when she lays down at night.  I discussed the next option which would be referring her to a surgeon to have part or all of the thyroid gland removed.  She would like to hold off for now stating that symptoms are mild. - TSH+T4F+T3Free  3. Major depressive disorder, single episode, mild ([place]) 4. PMS (premenstrual syndrome) Patient plans to keep appointment with therapist next week.  We discussed starting her on Zoloft which she has been on before and found it helpful.  She would like to try taking it every day instead of the days leading up to her cycles.  I will have her start on low-dose and then increase it to 50 mg after 1 month. - sertraline (ZOLOFT) 50 MG tablet; 1/2 tab PO daily x 4 wks then 1 tab daily on subsequent  refills  Dispense: 30 tablet; Refill: 3    Patient was given the opportunity to ask questions.  Patient verbalized understanding of the plan and was able to repeat key elements of the plan.   Orders Placed This Encounter  Procedures   TSH+T4F+T3Free      Requested Prescriptions   Signed Prescriptions Disp Refills   sertraline (ZOLOFT) 50 MG tablet 30 tablet 3    Sig: 1/2 tab PO daily x 4 wks then 1 tab daily on subsequent refills    Return in about 4 months (around 07/02/2021).  Karle Plumber, MD, FACP

## 2021-03-03 LAB — TSH+T4F+T3FREE
Free T4: 1.62 ng/dL (ref 0.82–1.77)
T3, Free: 2.5 pg/mL (ref 2.0–4.4)
TSH: 2.06 u[IU]/mL (ref 0.450–4.500)

## 2021-03-08 DIAGNOSIS — F330 Major depressive disorder, recurrent, mild: Secondary | ICD-10-CM | POA: Diagnosis not present

## 2021-03-19 DIAGNOSIS — F419 Anxiety disorder, unspecified: Secondary | ICD-10-CM | POA: Diagnosis not present

## 2021-03-19 DIAGNOSIS — F900 Attention-deficit hyperactivity disorder, predominantly inattentive type: Secondary | ICD-10-CM | POA: Diagnosis not present

## 2021-03-19 DIAGNOSIS — Z79899 Other long term (current) drug therapy: Secondary | ICD-10-CM | POA: Diagnosis not present

## 2021-03-29 DIAGNOSIS — F330 Major depressive disorder, recurrent, mild: Secondary | ICD-10-CM | POA: Diagnosis not present

## 2021-04-16 DIAGNOSIS — F330 Major depressive disorder, recurrent, mild: Secondary | ICD-10-CM | POA: Diagnosis not present

## 2021-04-25 DIAGNOSIS — Z20822 Contact with and (suspected) exposure to covid-19: Secondary | ICD-10-CM | POA: Diagnosis not present

## 2021-05-14 ENCOUNTER — Other Ambulatory Visit: Payer: Self-pay | Admitting: Internal Medicine

## 2021-07-02 ENCOUNTER — Ambulatory Visit: Payer: BC Managed Care – PPO | Admitting: Internal Medicine

## 2021-07-12 DIAGNOSIS — Z23 Encounter for immunization: Secondary | ICD-10-CM | POA: Diagnosis not present

## 2021-07-13 DIAGNOSIS — Z79899 Other long term (current) drug therapy: Secondary | ICD-10-CM | POA: Diagnosis not present

## 2021-07-13 DIAGNOSIS — F902 Attention-deficit hyperactivity disorder, combined type: Secondary | ICD-10-CM | POA: Diagnosis not present

## 2021-07-13 DIAGNOSIS — F419 Anxiety disorder, unspecified: Secondary | ICD-10-CM | POA: Diagnosis not present

## 2021-07-13 DIAGNOSIS — F900 Attention-deficit hyperactivity disorder, predominantly inattentive type: Secondary | ICD-10-CM | POA: Diagnosis not present

## 2021-07-19 ENCOUNTER — Ambulatory Visit: Payer: BC Managed Care – PPO | Admitting: Physician Assistant

## 2021-07-27 ENCOUNTER — Other Ambulatory Visit: Payer: Self-pay | Admitting: Internal Medicine

## 2021-07-27 DIAGNOSIS — N943 Premenstrual tension syndrome: Secondary | ICD-10-CM

## 2021-07-27 DIAGNOSIS — F320 Major depressive disorder, single episode, mild: Secondary | ICD-10-CM

## 2021-07-27 NOTE — Telephone Encounter (Signed)
Requested Prescriptions  Pending Prescriptions Disp Refills  . sertraline (ZOLOFT) 50 MG tablet [Pharmacy Med Name: SERTRALINE HCL 50 MG TABLET] 30 tablet 0    Sig: TAKE 1/2 TABLET BY MOUTH DAILY FOR 4 WEEKS, THEN TAKE 1 TAB DAILY ON SUBSEQUENT REFILLS     Psychiatry:  Antidepressants - SSRI Passed - 07/27/2021  1:56 AM      Passed - Completed PHQ-2 or PHQ-9 in the last 360 days      Passed - Valid encounter within last 6 months    Recent Outpatient Visits          4 months ago Essential hypertension   [place], MD   6 months ago Essential hypertension   [place], Jarome Matin, RPH-CPP   6 months ago Essential hypertension   [place], Jarome Matin, RPH-CPP   7 months ago Essential hypertension   [place], Stephen L, RPH-CPP   7 months ago Essential hypertension   [place], MD      Future Appointments            In 5 days Thereasa Solo, Casimer Bilis [place]

## 2021-08-01 ENCOUNTER — Ambulatory Visit: Payer: BC Managed Care – PPO | Admitting: Physician Assistant

## 2021-08-02 ENCOUNTER — Ambulatory Visit: Payer: BC Managed Care – PPO | Attending: Internal Medicine | Admitting: Physician Assistant

## 2021-08-02 ENCOUNTER — Encounter: Payer: Self-pay | Admitting: Physician Assistant

## 2021-08-02 ENCOUNTER — Other Ambulatory Visit: Payer: Self-pay

## 2021-08-02 VITALS — BP 123/85 | HR 69 | Ht 66.0 in | Wt 163.8 lb

## 2021-08-02 DIAGNOSIS — N943 Premenstrual tension syndrome: Secondary | ICD-10-CM

## 2021-08-02 DIAGNOSIS — F320 Major depressive disorder, single episode, mild: Secondary | ICD-10-CM

## 2021-08-02 DIAGNOSIS — E559 Vitamin D deficiency, unspecified: Secondary | ICD-10-CM

## 2021-08-02 DIAGNOSIS — I10 Essential (primary) hypertension: Secondary | ICD-10-CM | POA: Diagnosis not present

## 2021-08-02 DIAGNOSIS — G4700 Insomnia, unspecified: Secondary | ICD-10-CM

## 2021-08-02 DIAGNOSIS — E782 Mixed hyperlipidemia: Secondary | ICD-10-CM | POA: Diagnosis not present

## 2021-08-02 MED ORDER — PROPRANOLOL HCL ER 160 MG PO CP24: 160.0000 mg | ORAL_CAPSULE | Freq: Every day | ORAL | 1 refills | Status: DC

## 2021-08-02 MED ORDER — SERTRALINE HCL 50 MG PO TABS: ORAL_TABLET | ORAL | 1 refills | Status: DC

## 2021-08-02 MED ORDER — TRAZODONE HCL 50 MG PO TABS
25.0000 mg | ORAL_TABLET | Freq: Every evening | ORAL | 2 refills | Status: DC | PRN
Start: 2021-08-02 — End: 2023-04-15

## 2021-08-02 MED ORDER — AMLODIPINE BESYLATE 10 MG PO TABS
10.0000 mg | ORAL_TABLET | Freq: Every day | ORAL | 1 refills | Status: DC
Start: 2021-08-02 — End: 2022-07-16

## 2021-08-02 NOTE — Progress Notes (Signed)
Patient ID: Evelyn Lawson, female   DOB: 1981/06/08, 40 y.o.   MRN: [id number]   Evelyn Lawson, is a 40 y.o. female  MLJ:[id number]  HQR:[id number]  DOB - 1980/11/09  Chief Complaint  Patient presents with   Hypertension       Subjective:   Evelyn Lawson is a 40 y.o. female here today for a follow up visit and med RF.  Zoloft is working well but she still has trouble sleeping.  No new issues or concerns.  BP has been controlled.    No problems updated.  ALLERGIES: Allergies  Allergen Reactions   Other Swelling and Rash    Peppers    Tomato Swelling and Rash    Paste is fine     PAST MEDICAL HISTORY: Past Medical History:  Diagnosis Date   ADHD (attention deficit hyperactivity disorder)    Enlarged thyroid    ultasound in epic 08-07-2020  bilateral nodules   (08-16-2020 per pt pcp has referred her to an endocrinologist)   Hemorrhoids    History of seizures as a child    last febrile seizure age 21   Hypertension    followed by pcp   Social anxiety disorder    Wears contact lenses     MEDICATIONS AT HOME: Prior to Admission medications   Medication Sig Start Date End Date Taking? Authorizing Provider  acetaminophen (TYLENOL) 325 MG tablet Take 650 mg by mouth every 6 (six) hours as needed (for pain.).   Yes [provider]  amphetamine-dextroamphetamine (ADDERALL) 10 MG tablet Take 20 mg by mouth daily. 05/08/20  Yes [provider]  ibuprofen (ADVIL) 800 MG tablet Take 1 tablet (800 mg total) by mouth every 8 (eight) hours as needed. 08/25/20  Yes Servando Salina, MD  Multiple Vitamin (MULTIVITAMIN WITH MINERALS) TABS tablet Take 1 tablet by mouth daily. Women's Multivitamin   Yes [provider]  traZODone (DESYREL) 50 MG tablet Take 0.5-1 tablets (25-50 mg total) by mouth at bedtime as needed for sleep. 08/02/21  Yes Treyce Spillers M, PA-C  amLODipine (NORVASC) 10 MG tablet Take 1 tablet (10 mg total) by mouth daily. 08/02/21   Argentina Donovan, PA-C  propranolol ER (INDERAL LA) 160 MG SR capsule Take 1 capsule (160 mg total) by mouth daily. 08/02/21   Argentina Donovan, PA-C  sertraline (ZOLOFT) 50 MG tablet TAKE 1 TAB DAILY 08/02/21   Naphtali Zywicki, Dionne Bucy, PA-C    ROS: Neg HEENT Neg resp Neg cardiac Neg GI Neg GU Neg MS Neg psych Neg neuro  Objective:   Vitals:   08/02/21 1406  BP: 123/85  Pulse: 69  SpO2: 100%  Weight: 163 lb 12.8 oz (74.3 kg)  Height: 5\' 6"  (1.676 m)   Exam General appearance : Awake, alert, not in any distress. Speech Clear. Not toxic looking HEENT: Atraumatic and Normocephalic Neck: Supple, no JVD. No cervical lymphadenopathy.  Chest: Good air entry bilaterally, CTAB.  No rales/rhonchi/wheezing CVS: S1 S2 regular, no murmurs.  Extremities: B/L Lower Ext shows no edema, both legs are warm to touch Neurology: Awake alert, and oriented X 3, CN II-XII intact, Non focal Skin: No Rash  Data Review No results found for: HGBA1C  Assessment & Plan   1. Major depressive disorder, single episode, mild (HCC) stable - sertraline (ZOLOFT) 50 MG tablet; TAKE 1 TAB DAILY  Dispense: 90 tablet; Refill: 1 - propranolol ER (INDERAL LA) 160 MG SR capsule; Take 1 capsule (160 mg total) by mouth daily.  Dispense: 90 capsule; Refill: 1  2. PMS (premenstrual syndrome) - sertraline (ZOLOFT) 50 MG tablet; TAKE 1 TAB DAILY  Dispense: 90 tablet; Refill: 1  3. Vitamin D deficiency - Vitamin D, 25-hydroxy - Comprehensive metabolic panel  4. Mixed hyperlipidemia - Comprehensive metabolic panel - Lipid panel - CBC with Differential/Platelet  5. Essential hypertension - propranolol ER (INDERAL LA) 160 MG SR capsule; Take 1 capsule (160 mg total) by mouth daily.  Dispense: 90 capsule; Refill: 1 - amLODipine (NORVASC) 10 MG tablet; Take 1 tablet (10 mg total) by mouth daily.  Dispense: 90 tablet; Refill: 1 - Comprehensive metabolic panel  6. Insomnia, unspecified type - traZODone (DESYREL) 50 MG tablet; Take  0.5-1 tablets (25-50 mg total) by mouth at bedtime as needed for sleep.  Dispense: 60 tablet; Refill: 2  Thyroid panel WNL 02/2021  Patient have been counseled extensively about nutrition and exercise. Other issues discussed during this visit include: low cholesterol diet, weight control and daily exercise, foot care, annual eye examinations at Ophthalmology, importance of adherence with medications and regular follow-up. We also discussed long term complications of uncontrolled diabetes and hypertension.   Return in about 6 months (around 01/30/2022) for PCP for chronic conditions.  The patient was given clear instructions to go to ER or return to medical center if symptoms don't improve, worsen or new problems develop. The patient verbalized understanding. The patient was told to call to get lab results if they haven't heard anything in the next week.      Freeman Caldron, PA-C [facility] and [place], [place]   08/02/2021, 2:39 PM

## 2021-08-03 LAB — CBC WITH DIFFERENTIAL/PLATELET
Basophils Absolute: 0.1 10*3/uL (ref 0.0–0.2)
Basos: 1 %
EOS (ABSOLUTE): 0.2 10*3/uL (ref 0.0–0.4)
Eos: 3 %
Hematocrit: 40.9 % (ref 34.0–46.6)
Hemoglobin: 13.5 g/dL (ref 11.1–15.9)
Immature Grans (Abs): 0 10*3/uL (ref 0.0–0.1)
Immature Granulocytes: 0 %
Lymphocytes Absolute: 1.9 10*3/uL (ref 0.7–3.1)
Lymphs: 28 %
MCH: 31.4 pg (ref 26.6–33.0)
MCHC: 33 g/dL (ref 31.5–35.7)
MCV: 95 fL (ref 79–97)
Monocytes Absolute: 0.5 10*3/uL (ref 0.1–0.9)
Monocytes: 8 %
Neutrophils Absolute: 4 10*3/uL (ref 1.4–7.0)
Neutrophils: 60 %
Platelets: 285 10*3/uL (ref 150–450)
RBC: 4.3 x10E6/uL (ref 3.77–5.28)
RDW: 11.8 % (ref 11.7–15.4)
WBC: 6.8 10*3/uL (ref 3.4–10.8)

## 2021-08-03 LAB — COMPREHENSIVE METABOLIC PANEL
ALT: 8 IU/L (ref 0–32)
AST: 13 IU/L (ref 0–40)
Albumin/Globulin Ratio: 2.2 (ref 1.2–2.2)
Albumin: 5.2 g/dL — ABNORMAL HIGH (ref 3.8–4.8)
Alkaline Phosphatase: 63 IU/L (ref 44–121)
BUN/Creatinine Ratio: 10 (ref 9–23)
BUN: 9 mg/dL (ref 6–20)
Bilirubin Total: 0.4 mg/dL (ref 0.0–1.2)
CO2: 26 mmol/L (ref 20–29)
Calcium: 9.7 mg/dL (ref 8.7–10.2)
Chloride: 98 mmol/L (ref 96–106)
Creatinine, Ser: 0.88 mg/dL (ref 0.57–1.00)
Globulin, Total: 2.4 g/dL (ref 1.5–4.5)
Glucose: 76 mg/dL (ref 70–99)
Potassium: 4.2 mmol/L (ref 3.5–5.2)
Sodium: 139 mmol/L (ref 134–144)
Total Protein: 7.6 g/dL (ref 6.0–8.5)
eGFR: 86 mL/min/{1.73_m2} (ref >59)

## 2021-08-03 LAB — LIPID PANEL
Chol/HDL Ratio: 2.3 ratio (ref 0.0–4.4)
Cholesterol, Total: 235 mg/dL — ABNORMAL HIGH (ref 100–199)
HDL: 103 mg/dL (ref >39)
LDL Chol Calc (NIH): 119 mg/dL — ABNORMAL HIGH (ref 0–99)
Triglycerides: 79 mg/dL (ref 0–149)
VLDL Cholesterol Cal: 13 mg/dL (ref 5–40)

## 2021-08-03 LAB — VITAMIN D 25 HYDROXY (VIT D DEFICIENCY, FRACTURES): Vit D, 25-Hydroxy: 26.9 ng/mL — ABNORMAL LOW (ref 30.0–100.0)

## 2021-09-03 ENCOUNTER — Encounter: Payer: Self-pay | Admitting: Internal Medicine

## 2021-09-04 ENCOUNTER — Other Ambulatory Visit: Payer: Self-pay | Admitting: Internal Medicine

## 2021-09-04 ENCOUNTER — Ambulatory Visit: Payer: BC Managed Care – PPO | Attending: Internal Medicine | Admitting: Internal Medicine

## 2021-09-04 ENCOUNTER — Other Ambulatory Visit: Payer: Self-pay

## 2021-09-04 ENCOUNTER — Ambulatory Visit (HOSPITAL_COMMUNITY)
Admission: RE | Admit: 2021-09-04 | Discharge: 2021-09-04 | Disposition: A | Discharge status: Home / Self Care | Payer: BC Managed Care – PPO | Source: Ambulatory Visit | Attending: Internal Medicine | Admitting: Internal Medicine

## 2021-09-04 VITALS — BP 122/72 | HR 73 | Ht 66.0 in | Wt 167.8 lb

## 2021-09-04 DIAGNOSIS — J989 Respiratory disorder, unspecified: Secondary | ICD-10-CM

## 2021-09-04 DIAGNOSIS — R0602 Shortness of breath: Secondary | ICD-10-CM | POA: Diagnosis not present

## 2021-09-04 DIAGNOSIS — J029 Acute pharyngitis, unspecified: Secondary | ICD-10-CM | POA: Diagnosis not present

## 2021-09-04 DIAGNOSIS — R059 Cough, unspecified: Secondary | ICD-10-CM | POA: Diagnosis not present

## 2021-09-04 DIAGNOSIS — R6883 Chills (without fever): Secondary | ICD-10-CM | POA: Diagnosis not present

## 2021-09-04 LAB — POCT RAPID STREP A (OFFICE): Rapid Strep A Screen: NEGATIVE

## 2021-09-04 LAB — POCT INFLUENZA A/B
Influenza A, POC: NEGATIVE
Influenza B, POC: NEGATIVE

## 2021-09-04 MED ORDER — BENZONATATE 100 MG PO CAPS: 100.0000 mg | ORAL_CAPSULE | Freq: Two times a day (BID) | ORAL | 0 refills | Status: DC | PRN

## 2021-09-04 MED ORDER — PROAIR RESPICLICK 108 (90 BASE) MCG/ACT IN AEPB: 2.0000 | INHALATION_SPRAY | Freq: Four times a day (QID) | RESPIRATORY_TRACT | 0 refills | Status: DC

## 2021-09-04 MED ORDER — ALBUTEROL SULFATE HFA 108 (90 BASE) MCG/ACT IN AERS: 2.0000 | INHALATION_SPRAY | Freq: Four times a day (QID) | RESPIRATORY_TRACT | 2 refills | Status: DC | PRN

## 2021-09-04 NOTE — Progress Notes (Signed)
Patient ID: Evelyn Lawson, female    DOB: 12/03/1980  MRN: [id number]  CC: Cough   Subjective: Evelyn Lawson is a 40 y.o. female who presents for UC visit Her concerns today include:  Patient with history of HTN, vitamin D insufficiency, HL, MNG/right dominant nodule (benign follicular changes on bx, ADHD (Dr. Rachel Moulds), migraines, mass RT breast (MMG through [facility] 06/2020.  Repeat in 6 mths)   Patient had sent a MyChart message yesterday complaining of acute respiratory symptoms. She complains of cough that started last week and got worse on Friday.  Cough is productive of a little phlegm.  It was associated with pain in the back between the shoulder blades with cough, feelings of hot and cold chills, fatigue, sore throat, chest congestion and shortness of breath.  No rhinorrhea.  No fever.  Denies any loss of taste or smell.  No recent sick contacts.  She lives with her mother and sister and neither of them have been sick.  She has had 4 COVID vaccines and also had her flu vaccine in October.  She has been taking Alka-Seltzer cold and sinus alternating it with TheraFlu.  Symptoms have improved some.  She did home COVID test 4 days ago and it was negative.  Patient Active Problem List   Diagnosis Date Noted   PMS (premenstrual syndrome) 03/02/2021   Major depressive disorder, single episode, mild ([place]) 03/02/2021   Mixed hyperlipidemia 11/30/2020   Stressful life event affecting family 11/30/2020   Thyroid nodule 11/30/2020   Multinodular goiter 09/25/2020   Encounter for sterilization 08/17/2020   Essential hypertension 08/01/2020   Thyroid enlargement 08/01/2020   Vitamin D deficiency 08/01/2020   Influenza vaccination declined 08/01/2020   External hemorrhoids with irritation/pain 01/31/2014   Constipation, chronic 01/31/2014   Internal hemorrhoids with complication 68/34/1962     Current Outpatient Medications on File Prior to Visit  Medication Sig Dispense Refill    acetaminophen (TYLENOL) 325 MG tablet Take 650 mg by mouth every 6 (six) hours as needed (for pain.).     amLODipine (NORVASC) 10 MG tablet Take 1 tablet (10 mg total) by mouth daily. 90 tablet 1   amphetamine-dextroamphetamine (ADDERALL) 10 MG tablet Take 20 mg by mouth daily.     ibuprofen (ADVIL) 800 MG tablet Take 1 tablet (800 mg total) by mouth every 8 (eight) hours as needed. 30 tablet 0   Multiple Vitamin (MULTIVITAMIN WITH MINERALS) TABS tablet Take 1 tablet by mouth daily. Women's Multivitamin     propranolol ER (INDERAL LA) 160 MG SR capsule Take 1 capsule (160 mg total) by mouth daily. 90 capsule 1   sertraline (ZOLOFT) 50 MG tablet TAKE 1 TAB DAILY 90 tablet 1   traZODone (DESYREL) 50 MG tablet Take 0.5-1 tablets (25-50 mg total) by mouth at bedtime as needed for sleep. 60 tablet 2   No current facility-administered medications on file prior to visit.    Allergies  Allergen Reactions   Other Swelling and Rash    Peppers    Tomato Swelling and Rash    Paste is fine     Social History   Socioeconomic History   Marital status: Single    Spouse name: Not on file   Number of children: 1   Years of education: Not on file   Highest education level: Bachelor's degree (e.g., BA, AB, BS)  Occupational History   Occupation: HR specialist  Tobacco Use   Smoking status: Never   Smokeless tobacco:  Never  Vaping Use   Vaping Use: Never used  Substance and Sexual Activity   Alcohol use: Yes    Comment: occasional   Drug use: Never   Sexual activity: Not on file  Other Topics Concern   Not on file  Social History Narrative   Not on file   Social Determinants of Health   Financial Resource Strain: Not on file  Food Insecurity: Not on file  Transportation Needs: Not on file  Physical Activity: Not on file  Stress: Not on file  Social Connections: Not on file  Intimate Partner Violence: Not on file    Family History  Problem Relation Age of Onset   Cancer Maternal  Grandmother        breast   Breast cancer Maternal Grandmother    Cancer Paternal Grandmother        breast   Breast cancer Paternal Grandmother    Cancer Paternal Grandfather        colon   Diabetes Maternal Grandfather    Breast cancer Maternal Aunt    Thyroid disease Neg Hx     Past Surgical History:  Procedure Laterality Date   BREAST ENHANCEMENT SURGERY  11/30/2008   HEMORRHOID SURGERY  2013   LAPAROSCOPIC TUBAL LIGATION Bilateral 08/25/2020   Procedure: LAPAROSCOPIC TUBAL LIGATION WITH BIPOLAR CAUTERY;  Surgeon: Servando Salina, MD;  Location: [place];  Service: Gynecology;  Laterality: Bilateral;    ROS: Review of Systems Negative except as stated above  PHYSICAL EXAM: BP 122/72    Pulse 73    Ht 5\' 6"  (1.676 m)    Wt 167 lb 12.8 oz (76.1 kg)    SpO2 100%    BMI 27.08 kg/m   Physical Exam  General appearance - alert, well appearing, young to middle-aged African-American female and in no distress Mental status - normal mood, behavior, speech, dress, motor activity, and thought processes Nose - normal and patent, no erythema, discharge or polyps Mouth -tonsils are mildly enlarged with slight erythema but without exudates. Neck -no cervical lymphadenopathy. Chest -breath sounds are decreased at the bases but without wheezes or crackles.  Breath sounds are clear on auscultation of the upper lungs and laterally. Heart - normal rate, regular rhythm, normal S1, S2, no murmurs, rubs, clicks or gallops  Results for orders placed or performed in visit on 09/04/21  Influenza A/B  Result Value Ref Range   Influenza A, POC Negative Negative   Influenza B, POC Negative Negative  POCT rapid strep A  Result Value Ref Range   Rapid Strep A Screen Negative Negative     CMP Latest Ref Rng & Units 08/02/2021 08/23/2020 08/01/2020  Glucose 70 - 99 mg/dL 76 96 84  BUN 6 - 20 mg/dL 9 7 10   Creatinine 0.57 - 1.00 mg/dL 0.88 1.00 0.96  Sodium 134 - 144 mmol/L 139  137 141  Potassium 3.5 - 5.2 mmol/L 4.2 3.8 4.1  Chloride 96 - 106 mmol/L 98 103 101  CO2 20 - 29 mmol/L 26 27 24   Calcium 8.7 - 10.2 mg/dL 9.7 9.0 9.9  Total Protein 6.0 - 8.5 g/dL 7.6 - 7.5  Total Bilirubin 0.0 - 1.2 mg/dL 0.4 - 0.8  Alkaline Phos 44 - 121 IU/L 63 - 60  AST 0 - 40 IU/L 13 - 11  ALT 0 - 32 IU/L 8 - 14   Lipid Panel     Component Value Date/Time   CHOL 235 (H) 08/02/2021 1440  TRIG 79 08/02/2021 1440   HDL 103 08/02/2021 1440   CHOLHDL 2.3 08/02/2021 1440   LDLCALC 119 (H) 08/02/2021 1440    CBC    Component Value Date/Time   WBC 6.8 08/02/2021 1440   WBC 5.8 08/25/2020 1222   RBC 4.30 08/02/2021 1440   RBC 4.79 08/25/2020 1222   HGB 13.5 08/02/2021 1440   HCT 40.9 08/02/2021 1440   PLT 285 08/02/2021 1440   MCV 95 08/02/2021 1440   MCH 31.4 08/02/2021 1440   MCH 32.2 08/25/2020 1222   MCHC 33.0 08/02/2021 1440   MCHC 32.2 08/25/2020 1222   RDW 11.8 08/02/2021 1440   LYMPHSABS 1.9 08/02/2021 1440   EOSABS 0.2 08/02/2021 1440   BASOSABS 0.1 08/02/2021 1440    ASSESSMENT AND PLAN: 1. Respiratory illness Symptoms suggestive of acute resp viral illness.  She reports some improvement.  Breath sounds are not heard clearly in the lower bases, I will send her to have chest x-ray because of this.  Screen for influenza and strep negative. Advised to use Tylenol as needed for any body aches.  I have prescribed Tessalon Perles and albuterol inhaler for her to use - Influenza A/B - benzonatate (TESSALON) 100 MG capsule; Take 1 capsule (100 mg total) by mouth 2 (two) times daily as needed for cough.  Dispense: 20 capsule; Refill: 0 - Albuterol Sulfate (PROAIR RESPICLICK) 735 (90 Base) MCG/ACT AEPB; Inhale 2 puffs into the lungs every 6 (six) hours.  Dispense: 1 each; Refill: 0 - DG Chest 2 View; Future  2. Pharyngitis, unspecified etiology Rapid strep test negative. - POCT rapid strep A    Patient was given the opportunity to ask questions.  Patient  verbalized understanding of the plan and was able to repeat key elements of the plan.   No orders of the defined types were placed in this encounter.    Requested Prescriptions    No prescriptions requested or ordered in this encounter    No follow-ups on file.  Karle Plumber, MD, FACP

## 2021-09-04 NOTE — Telephone Encounter (Signed)
Will forward to Luke  

## 2021-09-04 NOTE — Progress Notes (Signed)
Cough pain in chest and burning sensation on the mid left lower side of back.

## 2021-09-05 ENCOUNTER — Telehealth: Payer: Self-pay | Admitting: Internal Medicine

## 2021-09-05 NOTE — Telephone Encounter (Signed)
Sent pt a MyChart message

## 2021-09-27 MED ORDER — BENZONATATE 200 MG PO CAPS: 200.0000 mg | ORAL_CAPSULE | Freq: Two times a day (BID) | ORAL | 2 refills | Status: DC | PRN

## 2021-11-18 ENCOUNTER — Other Ambulatory Visit: Payer: Self-pay | Admitting: Internal Medicine

## 2021-11-18 DIAGNOSIS — N943 Premenstrual tension syndrome: Secondary | ICD-10-CM

## 2021-11-18 DIAGNOSIS — F320 Major depressive disorder, single episode, mild: Secondary | ICD-10-CM

## 2021-12-18 DIAGNOSIS — Z79899 Other long term (current) drug therapy: Secondary | ICD-10-CM | POA: Diagnosis not present

## 2021-12-18 DIAGNOSIS — F900 Attention-deficit hyperactivity disorder, predominantly inattentive type: Secondary | ICD-10-CM | POA: Diagnosis not present

## 2021-12-18 DIAGNOSIS — F419 Anxiety disorder, unspecified: Secondary | ICD-10-CM | POA: Diagnosis not present

## 2022-02-01 ENCOUNTER — Ambulatory Visit: Payer: BC Managed Care – PPO | Attending: Internal Medicine | Admitting: Internal Medicine

## 2022-02-01 DIAGNOSIS — E559 Vitamin D deficiency, unspecified: Secondary | ICD-10-CM | POA: Diagnosis not present

## 2022-02-01 DIAGNOSIS — I10 Essential (primary) hypertension: Secondary | ICD-10-CM

## 2022-02-01 DIAGNOSIS — N943 Premenstrual tension syndrome: Secondary | ICD-10-CM | POA: Diagnosis not present

## 2022-02-01 DIAGNOSIS — R110 Nausea: Secondary | ICD-10-CM

## 2022-02-01 DIAGNOSIS — E782 Mixed hyperlipidemia: Secondary | ICD-10-CM

## 2022-02-01 DIAGNOSIS — F320 Major depressive disorder, single episode, mild: Secondary | ICD-10-CM

## 2022-02-01 MED ORDER — SERTRALINE HCL 50 MG PO TABS: 50.0000 mg | ORAL_TABLET | Freq: Every day | ORAL | 2 refills | Status: DC

## 2022-02-01 MED ORDER — PROMETHAZINE HCL 12.5 MG PO TABS: 12.5000 mg | ORAL_TABLET | Freq: Every day | ORAL | 0 refills | Status: DC | PRN

## 2022-02-01 MED ORDER — PROPRANOLOL HCL ER 160 MG PO CP24: 160.0000 mg | ORAL_CAPSULE | Freq: Every day | ORAL | 1 refills | Status: DC

## 2022-02-01 NOTE — Progress Notes (Signed)
Virtual Visit via Video Note ? ?I connected with Evelyn Lawson on 02/01/2022 at 11:41 a.m by a video enabled telemedicine application and verified that I am speaking with the correct person using two identifiers. ? ?Location: ?Patient: at work ?Provider: Office ?  ?I discussed the limitations of evaluation and management by telemedicine and the availability of in person appointments. The patient expressed understanding and agreed to proceed. ? ?History of Present Illness: ?Patient with history of HTN, vitamin D insufficiency, HL, MNG/right dominant nodule (benign follicular changes on bx, ADHD (Dr. Rachel Moulds), migraines, mass RT breast (MMG through [facility] 06/2020.  This is her routine 30-monthfollow-up visit. ? ?HTN: Patient reports she is doing good with blood pressure.  Most recent reading was 117/80.  She limits salt in the foods.  Reports compliance with amlodipine 10 mg daily and propranolol. ? ?Migraines: She had a little bit of a flare when allergy season started but nothing major. ?She is needing refill on propranolol. ? ?Vitamin D level was low when lab test she had done in November.  Level was 26.9.  PA who saw her sent her a message telling her to take vitamin D 2000 units daily.  Patient tells me she has been taking a woman's gummy vitamin along with Vita-Fusion Vit D supplement daily. ? ?Patient also noted to have elevated cholesterol with LDL cholesterol of 119 and goal being less than 100.  Patient tells me that her ophthalmologist told her she has rings around her eyes that may be associated with elevated cholesterol.  I think she is referring to arcus senilis ? ?In terms of PMS/depression, she is doing well on Zoloft.  She requests refills.  She is also requesting something to use as needed for nausea which she gets during her menstrual cycles. ? ?Outpatient Encounter Medications as of 02/01/2022  ?Medication Sig  ? acetaminophen (TYLENOL) 325 MG tablet Take 650 mg by mouth every 6 (six) hours as  needed (for pain.).  ? albuterol (VENTOLIN HFA) 108 (90 Base) MCG/ACT inhaler Inhale 2 puffs into the lungs every 6 (six) hours as needed for wheezing or shortness of breath.  ? Albuterol Sulfate (PROAIR RESPICLICK) 1476(90 Base) MCG/ACT AEPB Inhale 2 puffs into the lungs every 6 (six) hours.  ? amLODipine (NORVASC) 10 MG tablet Take 1 tablet (10 mg total) by mouth daily.  ? amphetamine-dextroamphetamine (ADDERALL) 10 MG tablet Take 20 mg by mouth daily.  ? benzonatate (TESSALON) 200 MG capsule Take 1 capsule (200 mg total) by mouth 2 (two) times daily as needed for cough.  ? ibuprofen (ADVIL) 800 MG tablet Take 1 tablet (800 mg total) by mouth every 8 (eight) hours as needed.  ? Multiple Vitamin (MULTIVITAMIN WITH MINERALS) TABS tablet Take 1 tablet by mouth daily. Women's Multivitamin  ? propranolol ER (INDERAL LA) 160 MG SR capsule Take 1 capsule (160 mg total) by mouth daily.  ? sertraline (ZOLOFT) 50 MG tablet Take 1 tablet (50 mg total) by mouth daily.  ? traZODone (DESYREL) 50 MG tablet Take 0.5-1 tablets (25-50 mg total) by mouth at bedtime as needed for sleep.  ? ?No facility-administered encounter medications on file as of 02/01/2022.  ? ? ?  ?Observations/Objective: ?M47African-American female sitting in chair in NAD. ? ?Assessment and Plan: ?1. Essential hypertension ?Reported blood pressure is at goal.  She will continue Norvasc and propranolol. ?- propranolol ER (INDERAL LA) 160 MG SR capsule; Take 1 capsule (160 mg total) by mouth daily.  Dispense: 90  capsule; Refill: 1 ? ?2. Major depressive disorder, single episode, mild ([place]) ?Doing well on Zoloft.  Refills given. ?- sertraline (ZOLOFT) 50 MG tablet; Take 1 tablet (50 mg total) by mouth daily.  Dispense: 90 tablet; Refill: 2 ?- propranolol ER (INDERAL LA) 160 MG SR capsule; Take 1 capsule (160 mg total) by mouth daily.  Dispense: 90 capsule; Refill: 1 ? ?3. PMS (premenstrual syndrome) ?- sertraline (ZOLOFT) 50 MG tablet; Take 1 tablet (50 mg  total) by mouth daily.  Dispense: 90 tablet; Refill: 2 ? ?4. Vitamin D deficiency ?Advised patient that the multivitamin that she is taking likely has vitamin D in it.  Advised that she looks on the back of the bottle to confirm and if it does she does not need to take the other vitamin D supplement.  She can also send me a message via MyChart with the doses of vitamin D in both so that I can give her further instructions.  I do not want her taking too much vitamin D. ? ?5. Mixed hyperlipidemia ?Discussed the importance of healthy eating habits and regular exercise to help lower cholesterol.  Not at the level as yet that we need to put her on medication. ? ?6. Nausea ?I told her I will prescribe a low-dose of Phenergan for her to use as needed for nausea. ?- promethazine (PHENERGAN) 12.5 MG tablet; Take 1 tablet (12.5 mg total) by mouth daily as needed for nausea or vomiting (to use for nausea during menstrual cycles).  Dispense: 30 tablet; Refill: 0 ? ? ?Follow Up Instructions: ?6 months. ?  ?I discussed the assessment and treatment plan with the patient. The patient was provided an opportunity to ask questions and all were answered. The patient agreed with the plan and demonstrated an understanding of the instructions. ?  ?The patient was advised to call back or seek an in-person evaluation if the symptoms worsen or if the condition fails to improve as anticipated. ? ?I spent 17 minutes dedicated to the care of this patient on the date of this encounter to include previsit review of chart including my last note and last set of labs, face-to-face time with patient discussing diagnosis and management and postvisit placement of orders. ? ?This note has been created with Surveyor, quantity. Any transcriptional errors are unintentional. ? ?Karle Plumber, MD ? ?

## 2022-04-04 ENCOUNTER — Ambulatory Visit: Payer: Self-pay

## 2022-04-04 ENCOUNTER — Telehealth: Payer: Self-pay | Admitting: Physician Assistant

## 2022-04-04 DIAGNOSIS — Z91018 Allergy to other foods: Secondary | ICD-10-CM

## 2022-04-04 DIAGNOSIS — R1011 Right upper quadrant pain: Secondary | ICD-10-CM

## 2022-04-04 MED ORDER — FAMOTIDINE 20 MG PO TABS: 20.0000 mg | ORAL_TABLET | Freq: Two times a day (BID) | ORAL | 0 refills | Status: DC

## 2022-04-04 MED ORDER — PREDNISONE 10 MG (21) PO TBPK: ORAL_TABLET | ORAL | 0 refills | Status: DC

## 2022-04-04 NOTE — Telephone Encounter (Signed)
Pt called, LVMTCB, reviewed chart and had scheduled virtual UC visit but dont see where it was canceled on Mychart or no show. Wanted to fu with pt to see if she had appt or not and if not need to reschedule appt.

## 2022-04-04 NOTE — Telephone Encounter (Signed)
  Chief Complaint: allergic reaction Symptoms: R face swelling under eye and above, bottom lip swelling, throat irritation, indigestion, R abdomen tenderness Frequency: reaction occurred Monday Pertinent Negatives: Patient denies SOB or trouble swallowing  Disposition: '[]'$ ED /'[]'$ Urgent Care (no appt availability in office) / '[]'$ Appointment(In office/virtual)/ '[x]'$  [facility]/ '[]'$ Home Care/ '[]'$ Refused Recommended Disposition /'[]'$ [facility]/ '[]'$  Follow-up with PCP Additional Notes: pt had allergic reaction to green peppers. Took a benadryl Monday night but still present next morning. Swelling has resolved but still having indigestion feeling and tenderness on RUQ and irritation with throat. Last dose of benadryl was last night and also took Omeprazole OTC but didn't help. Scheduled pt for virtual UC visit today at 1615.   Reason for Disposition  [1] Took antihistamine (e.g., Benadryl) by mouth AND [2] no symptoms now  Answer Assessment - Initial Assessment Questions 1. MAIN SYMPTOM: "What is your main symptom?" "How bad is it?"       Still having lingering symptoms  2. RESPIRATORY STATUS: "Are you having difficulty breathing?"  (e.g., yes/no, wheezing, unable to complete a sentence)      no 3. SWALLOWING: "Can you swallow?" (e.g., yes/no, food, fluid, saliva)      Yes   5. ONSET: "When did the reaction start?" (Minutes or hours ago)      Ongoing since Monday  6. SUBSTANCE: "What are you reacting to?" "When did the contact occur?"      Green peppers  7. PREVIOUS REACTION: "Have you ever reacted to it before?" If Yes, ask: "What happened that time?"     Yes normally takes benadryl and symptoms resolve  Protocols used: Anaphylaxis-A-AH

## 2022-04-05 NOTE — Telephone Encounter (Signed)
Noted  

## 2022-04-05 NOTE — Telephone Encounter (Signed)
Pt. Reports she had a virtual visit with [facility] and was supposed to have medications sent to her pharmacy. No note in chart from UC. Instructed pt. To call them and let them know about medications, verbalizes understanding.

## 2022-04-12 DIAGNOSIS — Z79899 Other long term (current) drug therapy: Secondary | ICD-10-CM | POA: Diagnosis not present

## 2022-04-12 DIAGNOSIS — F900 Attention-deficit hyperactivity disorder, predominantly inattentive type: Secondary | ICD-10-CM | POA: Diagnosis not present

## 2022-04-12 DIAGNOSIS — F419 Anxiety disorder, unspecified: Secondary | ICD-10-CM | POA: Diagnosis not present

## 2022-04-15 DIAGNOSIS — F902 Attention-deficit hyperactivity disorder, combined type: Secondary | ICD-10-CM | POA: Diagnosis not present

## 2022-05-07 ENCOUNTER — Encounter: Payer: Self-pay | Admitting: Nurse Practitioner

## 2022-05-07 ENCOUNTER — Telehealth (HOSPITAL_BASED_OUTPATIENT_CLINIC_OR_DEPARTMENT_OTHER): Payer: BC Managed Care – PPO | Admitting: Nurse Practitioner

## 2022-05-07 DIAGNOSIS — L249 Irritant contact dermatitis, unspecified cause: Secondary | ICD-10-CM | POA: Diagnosis not present

## 2022-05-07 MED ORDER — TRIAMCINOLONE ACETONIDE 0.1 % EX CREA: 1.0000 | TOPICAL_CREAM | Freq: Two times a day (BID) | CUTANEOUS | 0 refills | Status: DC

## 2022-05-07 NOTE — Progress Notes (Signed)
Virtual Visit Note  I discussed the limitations, risks, security and privacy concerns of performing an evaluation and management service by video and the availability of in person appointments. I also discussed with the patient that there may be a patient responsible charge related to this service. The patient expressed understanding and agreed to proceed.    I connected with Evelyn Lawson on 05/07/22  at   8:50 AM EDT  EDT by VIDEO and verified that I am speaking with the correct person using two identifiers.   Location of Patient: Private Residence   Location of Provider: [place] participating in VIRTUAL visit: Evelyn Lawson Evelyn Lawson    History of Present Illness: VIRTUAL visit for: rash  Rash: Patient complains of rash involving the  posterior neck . Rash started several days ago. Appearance of rash at onset: dry patchy macular rash. Rash has not changed over time. Discomfort associated with rash: is pruritic.  Associated symptoms: none. Denies: fever. Patient has not had previous evaluation of rash. Patient has tried OTC hydrocortisone.  Response to treatment: minimal as she has only tried it for one day. Patient has not had contacts with similar rash. Patient has not identified precipitant. Patient has not had new exposures (soaps, lotions, laundry detergents, foods, medications, plants, insects or animals.) She denies a PMH of history of eczema    Past Medical History:  Diagnosis Date   ADHD (attention deficit hyperactivity disorder)    Enlarged thyroid    ultasound in epic 08-07-2020  bilateral nodules   (08-16-2020 per pt pcp has referred her to an endocrinologist)   Hemorrhoids    History of seizures as a child    last febrile seizure age 80   Hypertension    followed by pcp   Social anxiety disorder    Wears contact lenses     Past Surgical History:  Procedure Laterality Date   BREAST ENHANCEMENT SURGERY   11/30/2008   HEMORRHOID SURGERY  2013   LAPAROSCOPIC TUBAL LIGATION Bilateral 08/25/2020   Procedure: LAPAROSCOPIC TUBAL LIGATION WITH BIPOLAR CAUTERY;  Surgeon: Servando Salina, MD;  Location: [place];  Service: Gynecology;  Laterality: Bilateral;    Family History  Problem Relation Age of Onset   Cancer Maternal Grandmother        breast   Breast cancer Maternal Grandmother    Cancer Paternal Grandmother        breast   Breast cancer Paternal Grandmother    Cancer Paternal Grandfather        colon   Diabetes Maternal Grandfather    Breast cancer Maternal Aunt    Thyroid disease Neg Hx     Social History   Socioeconomic History   Marital status: Single    Spouse name: Not on file   Number of children: 1   Years of education: Not on file   Highest education level: Bachelor's degree (e.g., BA, AB, BS)  Occupational History   Occupation: HR specialist  Tobacco Use   Smoking status: Never   Smokeless tobacco: Never  Vaping Use   Vaping Use: Never used  Substance and Sexual Activity   Alcohol use: Yes    Comment: occasional   Drug use: Never   Sexual activity: Not on file  Other Topics Concern   Not on file  Social History Narrative   Not on file   Social Determinants of Health   Financial Resource Strain: Not on  file  Food Insecurity: Not on file  Transportation Needs: Not on file  Physical Activity: Not on file  Stress: Not on file  Social Connections: Not on file     Observations/Objective: Awake, alert and oriented x 3   ROS  Assessment and Plan: Diagnoses and all orders for this visit:  Irritant contact dermatitis, unspecified trigger -     triamcinolone cream (KENALOG) 0.1 %; Apply 1 Application topically 2 (two) times daily.     Follow Up Instructions Return if symptoms worsen or fail to improve.     I discussed the assessment and treatment plan with the patient. The patient was provided an opportunity to ask questions and  all were answered. The patient agreed with the plan and demonstrated an understanding of the instructions.   The patient was advised to call back or seek an in-person evaluation if the symptoms worsen or if the condition fails to improve as anticipated.  I provided 12 minutes of face-to-face time during this encounter including median intraservice time, reviewing previous notes, labs, imaging, medications and explaining diagnosis and management.  Gildardo Pounds, Lawson

## 2022-05-14 ENCOUNTER — Telehealth: Payer: BC Managed Care – PPO | Admitting: Physician Assistant

## 2022-05-14 DIAGNOSIS — J209 Acute bronchitis, unspecified: Secondary | ICD-10-CM

## 2022-05-14 MED ORDER — PREDNISONE 20 MG PO TABS: 40.0000 mg | ORAL_TABLET | Freq: Every day | ORAL | 0 refills | Status: DC

## 2022-05-14 MED ORDER — BENZONATATE 100 MG PO CAPS: 100.0000 mg | ORAL_CAPSULE | Freq: Three times a day (TID) | ORAL | 0 refills | Status: DC | PRN

## 2022-05-14 MED ORDER — AZITHROMYCIN 250 MG PO TABS: ORAL_TABLET | ORAL | 0 refills | Status: AC

## 2022-05-14 NOTE — Progress Notes (Signed)
Virtual Visit Consent   Evelyn Lawson, you are scheduled for a virtual visit with a [facility] provider today. Just as with appointments in the office, your consent must be obtained to participate. Your consent will be active for this visit and any virtual visit you may have with one of our providers in the next 365 days. If you have a MyChart account, a copy of this consent can be sent to you electronically.  As this is a virtual visit, video technology does not allow for your provider to perform a traditional examination. This may limit your provider's ability to fully assess your condition. If your provider identifies any concerns that need to be evaluated in person or the need to arrange testing (such as labs, EKG, etc.), we will make arrangements to do so. Although advances in technology are sophisticated, we cannot ensure that it will always work on either your end or our end. If the connection with a video visit is poor, the visit may have to be switched to a telephone visit. With either a video or telephone visit, we are not always able to ensure that we have a secure connection.  By engaging in this virtual visit, you consent to the provision of healthcare and authorize for your insurance to be billed (if applicable) for the services provided during this visit. Depending on your insurance coverage, you may receive a charge related to this service.  I need to obtain your verbal consent now. Are you willing to proceed with your visit today? Evelyn Lawson has provided verbal consent on 05/14/2022 for a virtual visit (video or telephone). Leeanne Rio, [place]  Date: 05/14/2022 9:14 AM  Virtual Visit via Video Note   I, Leeanne Rio, connected with  Evelyn Lawson  ([id number], June 28, 1981) on 05/14/22 at  8:45 AM EDT by a video-enabled telemedicine application and verified that I am speaking with the correct person using two identifiers.  Location: Patient: Virtual Visit Location Patient:  Home Provider: Virtual Visit Location Provider: Home Office   I discussed the limitations of evaluation and management by telemedicine and the availability of in person appointments. The patient expressed understanding and agreed to proceed.    History of Present Illness: Evelyn Lawson is a 41 y.o. who identifies as a female who was assigned female at birth, and is being seen today for URI symptoms starting last week. Notes her daughter and mother had similar symptoms so thought was a cold/virus. All COVID tested which was negative. Patient now noting substantial increase in congestion with cough becoming proidutive and associated with chest tightness and tenderness. Denies wheezing. Notes some heartburn alleviated with OTC prilosec. Thinks possibly due to all the PND.   Now productive.  HPI: HPI  Problems:  Patient Active Problem List   Diagnosis Date Noted   PMS (premenstrual syndrome) 03/02/2021   Major depressive disorder, single episode, mild ([place]) 03/02/2021   Mixed hyperlipidemia 11/30/2020   Stressful life event affecting family 11/30/2020   Thyroid nodule 11/30/2020   Multinodular goiter 09/25/2020   Encounter for sterilization 08/17/2020   Essential hypertension 08/01/2020   Thyroid enlargement 08/01/2020   Vitamin D deficiency 08/01/2020   Influenza vaccination declined 08/01/2020   External hemorrhoids with irritation/pain 01/31/2014   Constipation, chronic 01/31/2014   Internal hemorrhoids with complication 68/08/7516    Allergies:  Allergies  Allergen Reactions   Other Swelling and Rash    Peppers    Tomato Swelling and Rash  Paste is fine    Medications:  Current Outpatient Medications:    azithromycin (ZITHROMAX) 250 MG tablet, Take 2 tablets on day 1, then 1 tablet daily on days 2 through 5, Disp: 6 tablet, Rfl: 0   benzonatate (TESSALON) 100 MG capsule, Take 1 capsule (100 mg total) by mouth 3 (three) times daily as needed for cough., Disp: 30 capsule, Rfl:  0   predniSONE (DELTASONE) 20 MG tablet, Take 2 tablets (40 mg total) by mouth daily with breakfast., Disp: 10 tablet, Rfl: 0   acetaminophen (TYLENOL) 325 MG tablet, Take 650 mg by mouth every 6 (six) hours as needed (for pain.)., Disp: , Rfl:    albuterol (VENTOLIN HFA) 108 (90 Base) MCG/ACT inhaler, Inhale 2 puffs into the lungs every 6 (six) hours as needed for wheezing or shortness of breath., Disp: 18 g, Rfl: 2   Albuterol Sulfate (PROAIR RESPICLICK) 440 (90 Base) MCG/ACT AEPB, Inhale 2 puffs into the lungs every 6 (six) hours., Disp: 1 each, Rfl: 0   amLODipine (NORVASC) 10 MG tablet, Take 1 tablet (10 mg total) by mouth daily., Disp: 90 tablet, Rfl: 1   amphetamine-dextroamphetamine (ADDERALL) 10 MG tablet, Take 20 mg by mouth daily., Disp: , Rfl:    ibuprofen (ADVIL) 800 MG tablet, Take 1 tablet (800 mg total) by mouth every 8 (eight) hours as needed., Disp: 30 tablet, Rfl: 0   Multiple Vitamin (MULTIVITAMIN WITH MINERALS) TABS tablet, Take 1 tablet by mouth daily. Women's Multivitamin, Disp: , Rfl:    promethazine (PHENERGAN) 12.5 MG tablet, Take 1 tablet (12.5 mg total) by mouth daily as needed for nausea or vomiting (to use for nausea during menstrual cycles)., Disp: 30 tablet, Rfl: 0   propranolol ER (INDERAL LA) 160 MG SR capsule, Take 1 capsule (160 mg total) by mouth daily., Disp: 90 capsule, Rfl: 1   sertraline (ZOLOFT) 50 MG tablet, Take 1 tablet (50 mg total) by mouth daily., Disp: 90 tablet, Rfl: 2   traZODone (DESYREL) 50 MG tablet, Take 0.5-1 tablets (25-50 mg total) by mouth at bedtime as needed for sleep., Disp: 60 tablet, Rfl: 2   triamcinolone cream (KENALOG) 0.1 %, Apply 1 Application topically 2 (two) times daily., Disp: 60 g, Rfl: 0  Observations/Objective: Patient is well-developed, well-nourished in no acute distress.  Resting comfortably at home.  Head is normocephalic, atraumatic.  No labored breathing. Speech is clear and coherent with logical content.  Patient  is alert and oriented at baseline.  Assessment and Plan: 1. Acute bronchitis, unspecified organism - azithromycin (ZITHROMAX) 250 MG tablet; Take 2 tablets on day 1, then 1 tablet daily on days 2 through 5  Dispense: 6 tablet; Refill: 0 - benzonatate (TESSALON) 100 MG capsule; Take 1 capsule (100 mg total) by mouth 3 (three) times daily as needed for cough.  Dispense: 30 capsule; Refill: 0 - predniSONE (DELTASONE) 20 MG tablet; Take 2 tablets (40 mg total) by mouth daily with breakfast.  Dispense: 10 tablet; Refill: 0  Rx Tessalon and prednisone burst to help with cough and bronchospasm.  Increase fluids.  Rest.  Saline nasal spray.  Probiotic.  Mucinex as directed.  Humidifier in bedroom. Giving change in sputum, will add on antibiotic coverage with Azithromycin..  Call or return to clinic if symptoms are not improving.   Follow Up Instructions: I discussed the assessment and treatment plan with the patient. The patient was provided an opportunity to ask questions and all were answered. The patient agreed with the plan and  demonstrated an understanding of the instructions.  A copy of instructions were sent to the patient via MyChart unless otherwise noted below.   The patient was advised to call back or seek an in-person evaluation if the symptoms worsen or if the condition fails to improve as anticipated.  Time:  I spent 10 minutes with the patient via telehealth technology discussing the above problems/concerns.    Leeanne Rio, PA-C

## 2022-05-14 NOTE — Patient Instructions (Signed)
Evelyn Lawson, thank you for joining Leeanne Rio, PA-C for today's virtual visit.  While this provider is not your primary care provider (PCP), if your PCP is located in our provider database this encounter information will be shared with them immediately following your visit.  Consent: (Patient) Evelyn Lawson provided verbal consent for this virtual visit at the beginning of the encounter.  Current Medications:  Current Outpatient Medications:    acetaminophen (TYLENOL) 325 MG tablet, Take 650 mg by mouth every 6 (six) hours as needed (for pain.)., Disp: , Rfl:    albuterol (VENTOLIN HFA) 108 (90 Base) MCG/ACT inhaler, Inhale 2 puffs into the lungs every 6 (six) hours as needed for wheezing or shortness of breath., Disp: 18 g, Rfl: 2   Albuterol Sulfate (PROAIR RESPICLICK) 099 (90 Base) MCG/ACT AEPB, Inhale 2 puffs into the lungs every 6 (six) hours., Disp: 1 each, Rfl: 0   amLODipine (NORVASC) 10 MG tablet, Take 1 tablet (10 mg total) by mouth daily., Disp: 90 tablet, Rfl: 1   amphetamine-dextroamphetamine (ADDERALL) 10 MG tablet, Take 20 mg by mouth daily., Disp: , Rfl:    benzonatate (TESSALON) 200 MG capsule, Take 1 capsule (200 mg total) by mouth 2 (two) times daily as needed for cough., Disp: 30 capsule, Rfl: 2   ibuprofen (ADVIL) 800 MG tablet, Take 1 tablet (800 mg total) by mouth every 8 (eight) hours as needed., Disp: 30 tablet, Rfl: 0   Multiple Vitamin (MULTIVITAMIN WITH MINERALS) TABS tablet, Take 1 tablet by mouth daily. Women's Multivitamin, Disp: , Rfl:    promethazine (PHENERGAN) 12.5 MG tablet, Take 1 tablet (12.5 mg total) by mouth daily as needed for nausea or vomiting (to use for nausea during menstrual cycles)., Disp: 30 tablet, Rfl: 0   propranolol ER (INDERAL LA) 160 MG SR capsule, Take 1 capsule (160 mg total) by mouth daily., Disp: 90 capsule, Rfl: 1   sertraline (ZOLOFT) 50 MG tablet, Take 1 tablet (50 mg total) by mouth daily., Disp: 90 tablet, Rfl: 2    traZODone (DESYREL) 50 MG tablet, Take 0.5-1 tablets (25-50 mg total) by mouth at bedtime as needed for sleep., Disp: 60 tablet, Rfl: 2   triamcinolone cream (KENALOG) 0.1 %, Apply 1 Application topically 2 (two) times daily., Disp: 60 g, Rfl: 0   Medications ordered in this encounter:  No orders of the defined types were placed in this encounter.    *If you need refills on other medications prior to your next appointment, please contact your pharmacy*  Follow-Up: Call back or seek an in-person evaluation if the symptoms worsen or if the condition fails to improve as anticipated.  Other Instructions Take antibiotic (Azithromycin) as directed.  Increase fluids.  Get plenty of rest. Use Mucinex for congestion. Use the steroid and cough medication as directed. Take a daily probiotic (I recommend Align or Culturelle, but even Activia Yogurt may be beneficial).  A humidifier placed in the bedroom may offer some relief for a dry, scratchy throat of nasal irritation.  Read information below on acute bronchitis. Please call or return to clinic if symptoms are not improving.  Acute Bronchitis Bronchitis is when the airways that extend from the windpipe into the lungs get red, puffy, and painful (inflamed). Bronchitis often causes thick spit (mucus) to develop. This leads to a cough. A cough is the most common symptom of bronchitis. In acute bronchitis, the condition usually begins suddenly and goes away over time (usually in 2 weeks). Smoking, allergies,  and asthma can make bronchitis worse. Repeated episodes of bronchitis may cause more lung problems.  HOME CARE Rest. Drink enough fluids to keep your pee (urine) clear or pale yellow (unless you need to limit fluids as told by your doctor). Only take over-the-counter or prescription medicines as told by your doctor. Avoid smoking and secondhand smoke. These can make bronchitis worse. If you are a smoker, think about using nicotine gum or skin patches.  Quitting smoking will help your lungs heal faster. Reduce the chance of getting bronchitis again by: Washing your hands often. Avoiding people with cold symptoms. Trying not to touch your hands to your mouth, nose, or eyes. Follow up with your doctor as told.  GET HELP IF: Your symptoms do not improve after 1 week of treatment. Symptoms include: Cough. Fever. Coughing up thick spit. Body aches. Chest congestion. Chills. Shortness of breath. Sore throat.  GET HELP RIGHT AWAY IF:  You have an increased fever. You have chills. You have severe shortness of breath. You have bloody thick spit (sputum). You throw up (vomit) often. You lose too much body fluid (dehydration). You have a severe headache. You faint.  MAKE SURE YOU:  Understand these instructions. Will watch your condition. Will get help right away if you are not doing well or get worse. Document Released: 02/26/2008 Document Revised: 05/12/2013 Document Reviewed: 03/02/2013 [facility] Patient Information 2015 [place], [place]. This information is not intended to replace advice given to you by your health care provider. Make sure you discuss any questions you have with your health care provider.    If you have been instructed to have an in-person evaluation today at a local Urgent Care facility, please use the link below. It will take you to a list of all of our available [facility], including address, phone number and hours of operation. Please do not delay care.  [facility]  If you or a family member do not have a primary care provider, use the link below to schedule a visit and establish care. When you choose a [facility] primary care physician or advanced practice provider, you gain a long-term partner in health. Find a Primary Care Provider  Learn more about [facility]'s in-office and virtual care options: [facility]

## 2022-07-16 ENCOUNTER — Other Ambulatory Visit: Payer: Self-pay | Admitting: Internal Medicine

## 2022-07-16 ENCOUNTER — Encounter: Payer: Self-pay | Admitting: Internal Medicine

## 2022-07-16 DIAGNOSIS — I10 Essential (primary) hypertension: Secondary | ICD-10-CM

## 2022-07-16 MED ORDER — AMLODIPINE BESYLATE 10 MG PO TABS: 10.0000 mg | ORAL_TABLET | Freq: Every day | ORAL | 0 refills | Status: DC

## 2022-07-17 DIAGNOSIS — Z79899 Other long term (current) drug therapy: Secondary | ICD-10-CM | POA: Diagnosis not present

## 2022-07-17 DIAGNOSIS — F902 Attention-deficit hyperactivity disorder, combined type: Secondary | ICD-10-CM | POA: Diagnosis not present

## 2022-07-29 ENCOUNTER — Encounter: Payer: Self-pay | Admitting: Internal Medicine

## 2022-08-05 ENCOUNTER — Encounter: Payer: Self-pay | Admitting: Internal Medicine

## 2022-08-05 ENCOUNTER — Ambulatory Visit: Payer: BC Managed Care – PPO | Attending: Internal Medicine | Admitting: Internal Medicine

## 2022-08-05 VITALS — BP 110/82 | HR 74 | Temp 98.3°F | Ht 66.0 in | Wt 178.0 lb

## 2022-08-05 DIAGNOSIS — I10 Essential (primary) hypertension: Secondary | ICD-10-CM

## 2022-08-05 DIAGNOSIS — K219 Gastro-esophageal reflux disease without esophagitis: Secondary | ICD-10-CM | POA: Diagnosis not present

## 2022-08-05 DIAGNOSIS — Z23 Encounter for immunization: Secondary | ICD-10-CM | POA: Diagnosis not present

## 2022-08-05 DIAGNOSIS — N943 Premenstrual tension syndrome: Secondary | ICD-10-CM

## 2022-08-05 DIAGNOSIS — J4520 Mild intermittent asthma, uncomplicated: Secondary | ICD-10-CM

## 2022-08-05 DIAGNOSIS — E663 Overweight: Secondary | ICD-10-CM | POA: Diagnosis not present

## 2022-08-05 MED ORDER — AMLODIPINE BESYLATE 10 MG PO TABS: 10.0000 mg | ORAL_TABLET | Freq: Every day | ORAL | 1 refills | Status: DC

## 2022-08-05 MED ORDER — ALBUTEROL SULFATE HFA 108 (90 BASE) MCG/ACT IN AERS: 2.0000 | INHALATION_SPRAY | Freq: Four times a day (QID) | RESPIRATORY_TRACT | 2 refills | Status: DC | PRN

## 2022-08-05 MED ORDER — PROPRANOLOL HCL ER 160 MG PO CP24: 160.0000 mg | ORAL_CAPSULE | Freq: Every day | ORAL | 1 refills | Status: DC

## 2022-08-05 MED ORDER — OMEPRAZOLE 20 MG PO CPDR: 20.0000 mg | DELAYED_RELEASE_CAPSULE | Freq: Every day | ORAL | 6 refills | Status: DC

## 2022-08-05 MED ORDER — SERTRALINE HCL 50 MG PO TABS: 50.0000 mg | ORAL_TABLET | Freq: Every day | ORAL | 2 refills | Status: DC

## 2022-08-05 NOTE — Progress Notes (Signed)
Patient ID: Evelyn Lawson, female    DOB: 1981/02/03  MRN: [id number]  CC: Hypertension (HTN f/u. /Acid reflux, coughing, vomiting, pain radiating around chest, X1 per mo since July. Sx getting worse. Velta Addison to flu vax.)   Subjective: Evelyn Lawson is a 41 y.o. female who presents for chronic ds management Her concerns today include:  Patient with history of HTN, vitamin D insufficiency, HL, MNG/right dominant nodule (benign follicular changes on bx, ADHD (Dr. Rachel Moulds), migraines, mass RT breast (MMG through [facility] 06/2020.  Repeat in 6 mths)   HTN: reports she had an appt last wk and  BP was 114/70 Checks BP but not often Trying to limit salt Compliant wit meds which are Norvasc 10 mg daily and Propranolol LA 160 mg daily and took already this a.m  Patient complains of some intermittent upper abdominal/lower chest pains that started in July of this year.  First episode occurred in July after she ate a meal with green peppers.  In August, she had to call EMS due to burning sensation in the upper abdomen and lower chest that went into her throat.  She had eaten cheesecake prior to going to bed.  She was told that everything looked good but she declined being seen in the emergency room.  Last Friday, she had another episode.  She was watching TV and had eaten 2 slices of pizza.  She began feeling some pain in the upper abdomen that wrapped around the abdomen to the back.  She took some Tums and Prilosec and started vomiting.  Up 11 lbs from 1 yr ago Not getting in much exercise.  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  Eats something quick for lunch like [employer], [place].    Request RF on Albuterol.  Had to use a few times in last mth.  Finds it helpful  She also requests refill on Zoloft.  She takes this for PMS and mild depression.  Reports she is doing well on the medication.  HM:  due for flu shot and COVID booster.  Due for repeat MMG;  last mammogram was in 2021 and there was a question of right breast mass.  She was supposed to have a repeat study in 6 months but patient did not have it done.  States that at that time she had difficulty getting insurance to pay for it because she was under the age of 18.  She plans to call and schedule with [facility] mammography to have one done..  Will have pap with Dr. Garwin Brothers.     Patient Active Problem List   Diagnosis Date Noted   PMS (premenstrual syndrome) 03/02/2021   Major depressive disorder, single episode, mild ([place]) 03/02/2021   Mixed hyperlipidemia 11/30/2020   Stressful life event affecting family 11/30/2020   Thyroid nodule 11/30/2020   Multinodular goiter 09/25/2020   Encounter for sterilization 08/17/2020   Essential hypertension 08/01/2020   Thyroid enlargement 08/01/2020   Vitamin D deficiency 08/01/2020   Influenza vaccination declined 08/01/2020   External hemorrhoids with irritation/pain 01/31/2014   Constipation, chronic 01/31/2014   Internal hemorrhoids with complication 54/62/7035     Current Outpatient Medications on File Prior to Visit  Medication Sig Dispense Refill   acetaminophen (TYLENOL) 325 MG tablet Take 650 mg by mouth every 6 (six) hours as needed (for pain.).     Albuterol Sulfate (PROAIR RESPICLICK) 009 (90 Base) MCG/ACT AEPB Inhale  2 puffs into the lungs every 6 (six) hours. 1 each 0   amphetamine-dextroamphetamine (ADDERALL) 10 MG tablet Take 20 mg by mouth daily.     benzonatate (TESSALON) 100 MG capsule Take 1 capsule (100 mg total) by mouth 3 (three) times daily as needed for cough. 30 capsule 0   ibuprofen (ADVIL) 800 MG tablet Take 1 tablet (800 mg total) by mouth every 8 (eight) hours as needed. 30 tablet 0   Multiple Vitamin (MULTIVITAMIN WITH MINERALS) TABS tablet Take 1 tablet by mouth daily. Women's Multivitamin     predniSONE (DELTASONE) 20 MG tablet Take 2 tablets (40 mg total) by mouth daily with breakfast. 10 tablet 0   promethazine  (PHENERGAN) 12.5 MG tablet Take 1 tablet (12.5 mg total) by mouth daily as needed for nausea or vomiting (to use for nausea during menstrual cycles). 30 tablet 0   traZODone (DESYREL) 50 MG tablet Take 0.5-1 tablets (25-50 mg total) by mouth at bedtime as needed for sleep. 60 tablet 2   triamcinolone cream (KENALOG) 0.1 % Apply 1 Application topically 2 (two) times daily. 60 g 0   No current facility-administered medications on file prior to visit.    Allergies  Allergen Reactions   Other Swelling and Rash    Peppers    Tomato Swelling and Rash    Paste is fine     Social History   Socioeconomic History   Marital status: Single    Spouse name: Not on file   Number of children: 1   Years of education: Not on file   Highest education level: Bachelor's degree (e.g., BA, AB, BS)  Occupational History   Occupation: HR specialist  Tobacco Use   Smoking status: Never   Smokeless tobacco: Never  Vaping Use   Vaping Use: Never used  Substance and Sexual Activity   Alcohol use: Yes    Comment: occasional   Drug use: Never   Sexual activity: Not on file  Other Topics Concern   Not on file  Social History Narrative   Not on file   Social Determinants of Health   Financial Resource Strain: Not on file  Food Insecurity: Not on file  Transportation Needs: Not on file  Physical Activity: Not on file  Stress: Not on file  Social Connections: Not on file  Intimate Partner Violence: Not on file    Family History  Problem Relation Age of Onset   Cancer Maternal Grandmother        breast   Breast cancer Maternal Grandmother    Cancer Paternal Grandmother        breast   Breast cancer Paternal Grandmother    Cancer Paternal Grandfather        colon   Diabetes Maternal Grandfather    Breast cancer Maternal Aunt    Thyroid disease Neg Hx     Past Surgical History:  Procedure Laterality Date   BREAST ENHANCEMENT SURGERY  11/30/2008   HEMORRHOID SURGERY  2013   LAPAROSCOPIC  TUBAL LIGATION Bilateral 08/25/2020   Procedure: LAPAROSCOPIC TUBAL LIGATION WITH BIPOLAR CAUTERY;  Surgeon: Servando Salina, MD;  Location: [facility];  Service: Gynecology;  Laterality: Bilateral;    ROS: Review of Systems Negative except as stated above  PHYSICAL EXAM: BP 110/82   Pulse 74   Temp 98.3 F (36.8 C) (Oral)   Ht '5\' 6"'$  (1.676 m)   Wt 178 lb (80.7 kg)   SpO2 99%   BMI 28.73 kg/m  Wt Readings from Last 3 Encounters:  08/05/22 178 lb (80.7 kg)  09/04/21 167 lb 12.8 oz (76.1 kg)  08/02/21 163 lb 12.8 oz (74.3 kg)    Physical Exam  General appearance - alert, well appearing, and in no distress Mental status - normal mood, behavior, speech, dress, motor activity, and thought processes Neck - supple, no significant adenopathy Chest - clear to auscultation, no wheezes, rales or rhonchi, symmetric air entry Heart - normal rate, regular rhythm, normal S1, S2, no murmurs, rubs, clicks or gallops Abdomen - soft, nontender, nondistended, no masses or organomegaly Extremities - peripheral pulses normal, no pedal edema, no clubbing or cyanosis      Latest Ref Rng & Units 08/02/2021    2:40 PM 08/23/2020   10:43 AM 08/01/2020   10:21 AM  CMP  Glucose 70 - 99 mg/dL 76  96  84   BUN 6 - 20 mg/dL '9  7  10   '$ Creatinine 0.57 - 1.00 mg/dL 0.88  1.00  0.96   Sodium 134 - 144 mmol/L 139  137  141   Potassium 3.5 - 5.2 mmol/L 4.2  3.8  4.1   Chloride 96 - 106 mmol/L 98  103  101   CO2 20 - 29 mmol/L '26  27  24   '$ Calcium 8.7 - 10.2 mg/dL 9.7  9.0  9.9   Total Protein 6.0 - 8.5 g/dL 7.6   7.5   Total Bilirubin 0.0 - 1.2 mg/dL 0.4   0.8   Alkaline Phos 44 - 121 IU/L 63   60   AST 0 - 40 IU/L 13   11   ALT 0 - 32 IU/L 8   14    Lipid Panel     Component Value Date/Time   CHOL 235 (H) 08/02/2021 1440   TRIG 79 08/02/2021 1440   HDL 103 08/02/2021 1440   CHOLHDL 2.3 08/02/2021 1440   LDLCALC 119 (H) 08/02/2021 1440    CBC    Component Value  Date/Time   WBC 6.8 08/02/2021 1440   WBC 5.8 08/25/2020 1222   RBC 4.30 08/02/2021 1440   RBC 4.79 08/25/2020 1222   HGB 13.5 08/02/2021 1440   HCT 40.9 08/02/2021 1440   PLT 285 08/02/2021 1440   MCV 95 08/02/2021 1440   MCH 31.4 08/02/2021 1440   MCH 32.2 08/25/2020 1222   MCHC 33.0 08/02/2021 1440   MCHC 32.2 08/25/2020 1222   RDW 11.8 08/02/2021 1440   LYMPHSABS 1.9 08/02/2021 1440   EOSABS 0.2 08/02/2021 1440   BASOSABS 0.1 08/02/2021 1440    ASSESSMENT AND PLAN: 1. Essential hypertension Close to goal.  Continue amlodipine and propranolol.  Encourage patient to check blood pressure a few times a month with goal being 130/80 or lower. - amLODipine (NORVASC) 10 MG tablet; Take 1 tablet (10 mg total) by mouth daily.  Dispense: 90 tablet; Refill: 1 - propranolol ER (INDERAL LA) 160 MG SR capsule; Take 1 capsule (160 mg total) by mouth daily.  Dispense: 90 capsule; Refill: 1 - CBC - Comprehensive metabolic panel - Lipid panel  2. Gastroesophageal reflux disease without esophagitis Symptoms suggest acid reflux. GERD precautions discussed.  Advised to avoid certain foods like spicy foods, tomato-based foods, juices and excessive caffeine.  Advised to eat his last meal at least 2 to 3 hours before laying down at nights and to sleep with his head slightly elevated.   Start omeprazole Let me know if she continues to have  episodes like this as the next step would be to get an ultrasound of the gallbladder - omeprazole (PRILOSEC) 20 MG capsule; Take 1 capsule (20 mg total) by mouth daily.  Dispense: 30 capsule; Refill: 6  3. PMS (premenstrual syndrome) - sertraline (ZOLOFT) 50 MG tablet; Take 1 tablet (50 mg total) by mouth daily.  Dispense: 90 tablet; Refill: 2  4. Overweight (BMI 25.0-29.9) Discussed and encourage healthy eating habits.  Encourage meal planning.  Encouraged her to try to incorporate exercise into her regular daily activities.  5. Mild intermittent reactive  airway disease without complication - albuterol (VENTOLIN HFA) 108 (90 Base) MCG/ACT inhaler; Inhale 2 puffs into the lungs every 6 (six) hours as needed for wheezing or shortness of breath.  Dispense: 18 g; Refill: 2  6. Need for immunization against influenza - Flu Vaccine QUAD 73moIM (Fluarix, Fluzone & Alfiuria Quad PF)     Patient was given the opportunity to ask questions.  Patient verbalized understanding of the plan and was able to repeat key elements of the plan.   This documentation was completed using DRadio producer  Any transcriptional errors are unintentional.  Orders Placed This Encounter  Procedures   Flu Vaccine QUAD 656moM (Fluarix, Fluzone & Alfiuria Quad PF)   CBC   Comprehensive metabolic panel   Lipid panel     Requested Prescriptions   Signed Prescriptions Disp Refills   amLODipine (NORVASC) 10 MG tablet 90 tablet 1    Sig: Take 1 tablet (10 mg total) by mouth daily.   propranolol ER (INDERAL LA) 160 MG SR capsule 90 capsule 1    Sig: Take 1 capsule (160 mg total) by mouth daily.   sertraline (ZOLOFT) 50 MG tablet 90 tablet 2    Sig: Take 1 tablet (50 mg total) by mouth daily.   albuterol (VENTOLIN HFA) 108 (90 Base) MCG/ACT inhaler 18 g 2    Sig: Inhale 2 puffs into the lungs every 6 (six) hours as needed for wheezing or shortness of breath.   omeprazole (PRILOSEC) 20 MG capsule 30 capsule 6    Sig: Take 1 capsule (20 mg total) by mouth daily.    Return in about 6 months (around 02/03/2023).  DeKarle PlumberMD, FACP

## 2022-08-05 NOTE — Patient Instructions (Signed)
Gastroesophageal Reflux Disease, Adult  Gastroesophageal reflux (GER) happens when acid from the stomach flows up into the tube that connects the mouth and the stomach (esophagus). Normally, food travels down the esophagus and stays in the stomach to be digested. With GER, food and stomach acid sometimes move back up into the esophagus. You may have a disease called gastroesophageal reflux disease (GERD) if the reflux: Happens often. Causes frequent or very bad symptoms. Causes problems such as damage to the esophagus. When this happens, the esophagus becomes sore and swollen. Over time, GERD can make small holes (ulcers) in the lining of the esophagus. What are the causes? This condition is caused by a problem with the muscle between the esophagus and the stomach. When this muscle is weak or not normal, it does not close properly to keep food and acid from coming back up from the stomach. The muscle can be weak because of: Tobacco use. Pregnancy. Having a certain type of hernia (hiatal hernia). Alcohol use. Certain foods and drinks, such as coffee, chocolate, onions, and peppermint. What increases the risk? Being overweight. Having a disease that affects your connective tissue. Taking NSAIDs, such a ibuprofen. What are the signs or symptoms? Heartburn. Difficult or painful swallowing. The feeling of having a lump in the throat. A bitter taste in the mouth. Bad breath. Having a lot of saliva. Having an upset or bloated stomach. Burping. Chest pain. Different conditions can cause chest pain. Make sure you see your doctor if you have chest pain. Shortness of breath or wheezing. A long-term cough or a cough at night. Wearing away of the surface of teeth (tooth enamel). Weight loss. How is this treated? Making changes to your diet. Taking medicine. Having surgery. Treatment will depend on how bad your symptoms are. Follow these instructions at home: Eating and drinking  Follow a  diet as told by your doctor. You may need to avoid foods and drinks such as: Coffee and tea, with or without caffeine. Drinks that contain alcohol. Energy drinks and sports drinks. Bubbly (carbonated) drinks or sodas. Chocolate and cocoa. Peppermint and mint flavorings. Garlic and onions. Horseradish. Spicy and acidic foods. These include peppers, chili powder, curry powder, vinegar, hot sauces, and BBQ sauce. Citrus fruit juices and citrus fruits, such as oranges, lemons, and limes. Tomato-based foods. These include red sauce, chili, salsa, and pizza with red sauce. Fried and fatty foods. These include donuts, french fries, potato chips, and high-fat dressings. High-fat meats. These include hot dogs, rib eye steak, sausage, ham, and bacon. High-fat dairy items, such as whole milk, butter, and cream cheese. Eat small meals often. Avoid eating large meals. Avoid drinking large amounts of liquid with your meals. Avoid eating meals during the 2-3 hours before bedtime. Avoid lying down right after you eat. Do not exercise right after you eat. Lifestyle  Do not smoke or use any products that contain nicotine or tobacco. If you need help quitting, ask your doctor. Try to lower your stress. If you need help doing this, ask your doctor. If you are overweight, lose an amount of weight that is healthy for you. Ask your doctor about a safe weight loss goal. General instructions Pay attention to any changes in your symptoms. Take over-the-counter and prescription medicines only as told by your doctor. Do not take aspirin, ibuprofen, or other NSAIDs unless your doctor says it is okay. Wear loose clothes. Do not wear anything tight around your waist. Raise (elevate) the head of your bed about   6 inches (15 cm). You may need to use a wedge to do this. Avoid bending over if this makes your symptoms worse. Keep all follow-up visits. Contact a doctor if: You have new symptoms. You lose weight and you  do not know why. You have trouble swallowing or it hurts to swallow. You have wheezing or a cough that keeps happening. You have a hoarse voice. Your symptoms do not get better with treatment. Get help right away if: You have sudden pain in your arms, neck, jaw, teeth, or back. You suddenly feel sweaty, dizzy, or light-headed. You have chest pain or shortness of breath. You vomit and the vomit is green, yellow, or black, or it looks like blood or coffee grounds. You faint. Your poop (stool) is red, bloody, or black. You cannot swallow, drink, or eat. These symptoms may represent a serious problem that is an emergency. Do not wait to see if the symptoms will go away. Get medical help right away. Call your local emergency services (911 in the [place] Do not drive yourself to the hospital. Summary If a person has gastroesophageal reflux disease (GERD), food and stomach acid move back up into the esophagus and cause symptoms or problems such as damage to the esophagus. Treatment will depend on how bad your symptoms are. Follow a diet as told by your doctor. Take all medicines only as told by your doctor. This information is not intended to replace advice given to you by your health care provider. Make sure you discuss any questions you have with your health care provider. Document Revised: 03/20/2020 Document Reviewed: 03/20/2020 Elsevier Patient Education  2023 [employer]  

## 2022-08-06 LAB — CBC
Hematocrit: 41.6 % (ref 34.0–46.6)
Hemoglobin: 13.9 g/dL (ref 11.1–15.9)
MCH: 31.5 pg (ref 26.6–33.0)
MCHC: 33.4 g/dL (ref 31.5–35.7)
MCV: 94 fL (ref 79–97)
Platelets: 323 10*3/uL (ref 150–450)
RBC: 4.41 x10E6/uL (ref 3.77–5.28)
RDW: 11.7 % (ref 11.7–15.4)
WBC: 6 10*3/uL (ref 3.4–10.8)

## 2022-08-06 LAB — COMPREHENSIVE METABOLIC PANEL
ALT: 13 IU/L (ref 0–32)
AST: 18 IU/L (ref 0–40)
Albumin/Globulin Ratio: 2 (ref 1.2–2.2)
Albumin: 4.9 g/dL (ref 3.9–4.9)
Alkaline Phosphatase: 68 IU/L (ref 44–121)
BUN/Creatinine Ratio: 12 (ref 9–23)
BUN: 13 mg/dL (ref 6–24)
Bilirubin Total: 0.6 mg/dL (ref 0.0–1.2)
CO2: 25 mmol/L (ref 20–29)
Calcium: 9.9 mg/dL (ref 8.7–10.2)
Chloride: 99 mmol/L (ref 96–106)
Creatinine, Ser: 1.05 mg/dL — ABNORMAL HIGH (ref 0.57–1.00)
Globulin, Total: 2.4 g/dL (ref 1.5–4.5)
Glucose: 96 mg/dL (ref 70–99)
Potassium: 4.1 mmol/L (ref 3.5–5.2)
Sodium: 138 mmol/L (ref 134–144)
Total Protein: 7.3 g/dL (ref 6.0–8.5)
eGFR: 69 mL/min/{1.73_m2} (ref >59)

## 2022-08-06 LAB — LIPID PANEL
Chol/HDL Ratio: 2.4 ratio (ref 0.0–4.4)
Cholesterol, Total: 227 mg/dL — ABNORMAL HIGH (ref 100–199)
HDL: 96 mg/dL (ref >39)
LDL Chol Calc (NIH): 119 mg/dL — ABNORMAL HIGH (ref 0–99)
Triglycerides: 72 mg/dL (ref 0–149)
VLDL Cholesterol Cal: 12 mg/dL (ref 5–40)

## 2022-08-07 ENCOUNTER — Other Ambulatory Visit: Payer: Self-pay | Admitting: Internal Medicine

## 2022-08-07 ENCOUNTER — Encounter: Payer: Self-pay | Admitting: Internal Medicine

## 2022-08-07 DIAGNOSIS — R079 Chest pain, unspecified: Secondary | ICD-10-CM

## 2022-08-07 DIAGNOSIS — R109 Unspecified abdominal pain: Secondary | ICD-10-CM

## 2022-08-20 ENCOUNTER — Ambulatory Visit
Admission: RE | Admit: 2022-08-20 | Discharge: 2022-08-20 | Disposition: A | Discharge status: Home / Self Care | Payer: BC Managed Care – PPO | Source: Ambulatory Visit | Attending: Internal Medicine | Admitting: Internal Medicine

## 2022-08-20 DIAGNOSIS — R109 Unspecified abdominal pain: Secondary | ICD-10-CM

## 2022-08-21 ENCOUNTER — Encounter: Payer: Self-pay | Admitting: Internal Medicine

## 2022-08-21 ENCOUNTER — Other Ambulatory Visit: Payer: Self-pay | Admitting: Internal Medicine

## 2022-08-21 DIAGNOSIS — K8050 Calculus of bile duct without cholangitis or cholecystitis without obstruction: Secondary | ICD-10-CM

## 2022-08-21 DIAGNOSIS — K8020 Calculus of gallbladder without cholecystitis without obstruction: Secondary | ICD-10-CM

## 2022-08-26 ENCOUNTER — Encounter: Payer: Self-pay | Admitting: Internal Medicine

## 2022-08-30 ENCOUNTER — Ambulatory Visit: Payer: BC Managed Care – PPO | Admitting: Interventional Cardiology

## 2022-10-14 ENCOUNTER — Ambulatory Visit: Payer: BC Managed Care – PPO | Attending: Interventional Cardiology | Admitting: Interventional Cardiology

## 2022-12-23 ENCOUNTER — Ambulatory Visit: Payer: BC Managed Care – PPO | Attending: Internal Medicine | Admitting: Internal Medicine

## 2022-12-23 VITALS — BP 135/90 | HR 98 | Ht 66.0 in | Wt 170.2 lb

## 2022-12-23 DIAGNOSIS — L819 Disorder of pigmentation, unspecified: Secondary | ICD-10-CM

## 2022-12-23 DIAGNOSIS — J0100 Acute maxillary sinusitis, unspecified: Secondary | ICD-10-CM

## 2022-12-23 DIAGNOSIS — I10 Essential (primary) hypertension: Secondary | ICD-10-CM | POA: Diagnosis not present

## 2022-12-23 MED ORDER — AMOXICILLIN-POT CLAVULANATE 500-125 MG PO TABS: 1.0000 | ORAL_TABLET | Freq: Two times a day (BID) | ORAL | 0 refills | Status: DC

## 2022-12-23 NOTE — Progress Notes (Signed)
Patient ID: Evelyn Lawson, female    DOB: 28-Sep-1980  MRN: [id number]  CC: Sinus Problem   Subjective: Evelyn Lawson is a 42 y.o. female who presents for routine f/u Her concerns today include:  Patient with history of HTN, vitamin D insufficiency, HL, MNG/right dominant nodule (benign follicular changes on bx, ADHD (Dr. Rachel Moulds), migraines, mass RT breast (MMG through [facility] 06/2020.  Repeat in 6 mths)   Saw general surgery since last visit with me for bilary colic.  GB removal recommended Has a $800 co-pay or deductible which was cost prohibitive.  She has figured out the types of foods that cause flare and has stay away from them  C/o facial pain over maxillary sinues and over eyes x 1-2 wk. Sinus congestion with mucus productive of green/yellow mucus No fever Mucinex and Advil have helped some but symptoms still present  HTN: Patient is on Norvasc 10 mg daily and propranolol LA 160 mg daily.  Took meds already today.  Has not check BP recently.  Limits salt.  Has dark spots on cheeks x 1 mth.  Noticed it on mom as well.    No issues with depressions.  On Zoloft for PMS  Patient Active Problem List   Diagnosis Date Noted   PMS (premenstrual syndrome) 03/02/2021   Mixed hyperlipidemia 11/30/2020   Stressful life event affecting family 11/30/2020   Thyroid nodule 11/30/2020   Multinodular goiter 09/25/2020   Encounter for sterilization 08/17/2020   Essential hypertension 08/01/2020   Thyroid enlargement 08/01/2020   Vitamin D deficiency 08/01/2020   Influenza vaccination declined 08/01/2020   External hemorrhoids with irritation/pain 01/31/2014   Constipation, chronic 01/31/2014   Internal hemorrhoids with complication [id number]     Current Outpatient Medications on File Prior to Visit  Medication Sig Dispense Refill   acetaminophen (TYLENOL) 325 MG tablet Take 650 mg by mouth every 6 (six) hours as needed (for pain.).     albuterol (VENTOLIN HFA) 108 (90 Base)  MCG/ACT inhaler Inhale 2 puffs into the lungs every 6 (six) hours as needed for wheezing or shortness of breath. 18 g 2   Albuterol Sulfate (PROAIR RESPICLICK) [id number] (90 Base) MCG/ACT AEPB Inhale 2 puffs into the lungs every 6 (six) hours. 1 each 0   amLODipine (NORVASC) 10 MG tablet Take 1 tablet (10 mg total) by mouth daily. 90 tablet 1   amphetamine-dextroamphetamine (ADDERALL) 10 MG tablet Take 20 mg by mouth daily.     benzonatate (TESSALON) 100 MG capsule Take 1 capsule (100 mg total) by mouth 3 (three) times daily as needed for cough. 30 capsule 0   famotidine (PEPCID) 20 MG tablet Take 1 tablet (20 mg total) by mouth 2 (two) times daily. 30 tablet 0   ibuprofen (ADVIL) 800 MG tablet Take 1 tablet (800 mg total) by mouth every 8 (eight) hours as needed. 30 tablet 0   Multiple Vitamin (MULTIVITAMIN WITH MINERALS) TABS tablet Take 1 tablet by mouth daily. Women's Multivitamin     omeprazole (PRILOSEC) 20 MG capsule Take 1 capsule (20 mg total) by mouth daily. 30 capsule 6   predniSONE (DELTASONE) 20 MG tablet Take 2 tablets (40 mg total) by mouth daily with breakfast. 10 tablet 0   predniSONE (STERAPRED UNI-PAK 21 TAB) 10 MG (21) TBPK tablet 6 day taper; take as directed on package instructions 21 tablet 0   promethazine (PHENERGAN) 12.5 MG tablet Take 1 tablet (12.5 mg total) by mouth daily as needed for nausea or  vomiting (to use for nausea during menstrual cycles). 30 tablet 0   propranolol ER (INDERAL LA) 160 MG SR capsule Take 1 capsule (160 mg total) by mouth daily. 90 capsule 1   sertraline (ZOLOFT) 50 MG tablet Take 1 tablet (50 mg total) by mouth daily. 90 tablet 2   traZODone (DESYREL) 50 MG tablet Take 0.5-1 tablets (25-50 mg total) by mouth at bedtime as needed for sleep. 60 tablet 2   triamcinolone cream (KENALOG) 0.1 % Apply 1 Application topically 2 (two) times daily. 60 g 0   No current facility-administered medications on file prior to visit.    Allergies  Allergen Reactions    Other Swelling and Rash    Peppers    Tomato Swelling and Rash    Paste is fine     Social History   Socioeconomic History   Marital status: Single    Spouse name: Not on file   Number of children: 1   Years of education: Not on file   Highest education level: Bachelor's degree (e.g., BA, AB, BS)  Occupational History   Occupation: HR specialist  Tobacco Use   Smoking status: Never   Smokeless tobacco: Never  Vaping Use   Vaping Use: Never used  Substance and Sexual Activity   Alcohol use: Yes    Comment: occasional   Drug use: Never   Sexual activity: Not on file  Other Topics Concern   Not on file  Social History Narrative   ** Merged History Encounter **       Social Determinants of Health   Financial Resource Strain: Low Risk  (12/23/2022)   Overall Financial Resource Strain (CARDIA)    Difficulty of Paying Living Expenses: Not very hard  Food Insecurity: No Food Insecurity (12/23/2022)   Hunger Vital Sign    Worried About Running Out of Food in the Last Year: Never true    Ran Out of Food in the Last Year: Never true  Transportation Needs: No Transportation Needs (12/23/2022)   PRAPARE - Hydrologist (Medical): No    Lack of Transportation (Non-Medical): No  Physical Activity: Unknown (12/23/2022)   Exercise Vital Sign    Days of Exercise per Week: 0 days    Minutes of Exercise per Session: Not on file  Stress: No Stress Concern Present (12/23/2022)   [place]    Feeling of Stress : Only a little  Social Connections: Socially Isolated (12/23/2022)   Social Connection and Isolation Panel [NHANES]    Frequency of Communication with Friends and Family: More than three times a week    Frequency of Social Gatherings with Friends and Family: More than three times a week    Attends Religious Services: Never    Marine scientist or Organizations: No    Attends Programme researcher, broadcasting/film/video: Not on file    Marital Status: Never married  Human resources officer Violence: Not on file    Family History  Problem Relation Age of Onset   Cancer Maternal Grandmother        breast   Breast cancer Maternal Grandmother    Cancer Paternal Grandmother        breast   Breast cancer Paternal Grandmother    Cancer Paternal Grandfather        colon   Diabetes Maternal Grandfather    Breast cancer Maternal Aunt    Thyroid disease Neg Hx  Past Surgical History:  Procedure Laterality Date   BREAST ENHANCEMENT SURGERY  11/30/2008   HEMORRHOID SURGERY  2013   LAPAROSCOPIC TUBAL LIGATION Bilateral 08/25/2020   Procedure: LAPAROSCOPIC TUBAL LIGATION WITH BIPOLAR CAUTERY;  Surgeon: Servando Salina, MD;  Location: [facility];  Service: Gynecology;  Laterality: Bilateral;    ROS: Review of Systems Negative except as stated above  PHYSICAL EXAM: BP (!) 140/93 (BP Location: Left Arm, Patient Position: Sitting, Cuff Size: Normal)   Pulse 98   Ht 5\' 6"  (1.676 m)   Wt 170 lb 3.2 oz (77.2 kg)   SpO2 98%   BMI 27.47 kg/m   Physical Exam  General appearance - alert, well appearing, and in no distress Mental status - normal mood, behavior, speech, dress, motor activity, and thought processes Ears - bilateral TM's and external ear canals normal Nose -mild enlargement of nasal turbinates.  Patient has mild audible congestion. Mouth - mucous membranes moist, pharynx normal without lesions Neck - supple, no significant adenopathy Skin - faint small area of hyperpigmentation over both upper cheeks     12/23/2022    2:49 PM 08/05/2022   10:50 AM 09/04/2021   10:15 AM  Depression screen PHQ 2/9  Decreased Interest 0 0 0  Down, Depressed, Hopeless 0 0 0  PHQ - 2 Score 0 0 0  Altered sleeping 0 0 1  Tired, decreased energy 0 1 1  Change in appetite 0 1 1  Feeling bad or failure about yourself  0 0 0  Trouble concentrating 0 1 1  Moving slowly or  fidgety/restless 1 1 1   Suicidal thoughts 0 0 0  PHQ-9 Score 1 4 5         Latest Ref Rng & Units 08/05/2022   11:32 AM 08/02/2021    2:40 PM 08/23/2020   10:43 AM  CMP  Glucose 70 - 99 mg/dL 96  76  96   BUN 6 - 24 mg/dL 13  9  7    Creatinine 0.57 - 1.00 mg/dL 1.05  0.88  1.00   Sodium 134 - 144 mmol/L 138  139  137   Potassium 3.5 - 5.2 mmol/L 4.1  4.2  3.8   Chloride 96 - 106 mmol/L 99  98  103   CO2 20 - 29 mmol/L 25  26  27    Calcium 8.7 - 10.2 mg/dL 9.9  9.7  9.0   Total Protein 6.0 - 8.5 g/dL 7.3  7.6    Total Bilirubin 0.0 - 1.2 mg/dL 0.6  0.4    Alkaline Phos 44 - 121 IU/L 68  63    AST 0 - 40 IU/L 18  13    ALT 0 - 32 IU/L 13  8     Lipid Panel     Component Value Date/Time   CHOL 227 (H) 08/05/2022 1132   TRIG 72 08/05/2022 1132   HDL 96 08/05/2022 1132   CHOLHDL 2.4 08/05/2022 1132   LDLCALC 119 (H) 08/05/2022 1132    CBC    Component Value Date/Time   WBC 6.0 08/05/2022 1132   WBC 5.8 08/25/2020 1222   RBC 4.41 08/05/2022 1132   RBC 4.79 08/25/2020 1222   HGB 13.9 08/05/2022 1132   HCT 41.6 08/05/2022 1132   PLT 323 08/05/2022 1132   MCV 94 08/05/2022 1132   MCH 31.5 08/05/2022 1132   MCH 32.2 08/25/2020 1222   MCHC 33.4 08/05/2022 1132   MCHC 32.2 08/25/2020 1222  RDW 11.7 08/05/2022 1132   LYMPHSABS 1.9 08/02/2021 1440   EOSABS 0.2 08/02/2021 1440   BASOSABS 0.1 08/02/2021 1440    ASSESSMENT AND PLAN:  1. Essential hypertension -not at goal Pt to check BP a few times a wk and record readings.  Bring log on visit next mth when she comes for physical  2. Acute non-recurrent maxillary sinusitis -Will give a 7-day course of Augmentin.  Recommend purchasing Claritin over-the-counter and taking 1 daily. - amoxicillin-clavulanate (AUGMENTIN) 500-125 MG tablet; Take 1 tablet by mouth in the morning and at bedtime.  Dispense: 14 tablet; Refill: 0  3. Discolored skin Recommend trying Ambi fading cream OTC    Patient was given the opportunity  to ask questions.  Patient verbalized understanding of the plan and was able to repeat key elements of the plan.   This documentation was completed using Radio producer.  Any transcriptional errors are unintentional.  No orders of the defined types were placed in this encounter.    Requested Prescriptions   Signed Prescriptions Disp Refills   amoxicillin-clavulanate (AUGMENTIN) 500-125 MG tablet 14 tablet 0    Sig: Take 1 tablet by mouth in the morning and at bedtime.    Return if symptoms worsen or fail to improve.  Karle Plumber, MD, FACP

## 2022-12-23 NOTE — Patient Instructions (Addendum)
I have sent a prescription to your pharmacy for an antibiotic called Augmentin that you will take twice a day for 7 days.  I also recommend purchasing some Claritin over-the-counter and taking daily until symptoms resolve. Try using Ambi fading cream over the counter for facial skin discoloration.  Check BP twice a week and record readings.  Bring log on next visit.

## 2023-02-03 ENCOUNTER — Ambulatory Visit: Payer: BC Managed Care – PPO | Admitting: Internal Medicine

## 2023-02-07 ENCOUNTER — Other Ambulatory Visit (HOSPITAL_COMMUNITY)
Admission: RE | Admit: 2023-02-07 | Discharge: 2023-02-07 | Disposition: A | Discharge status: Home / Self Care | Payer: BC Managed Care – PPO | Source: Ambulatory Visit | Attending: Internal Medicine | Admitting: Internal Medicine

## 2023-02-07 ENCOUNTER — Ambulatory Visit: Payer: BC Managed Care – PPO | Attending: Internal Medicine | Admitting: Internal Medicine

## 2023-02-07 ENCOUNTER — Encounter: Payer: Self-pay | Admitting: Internal Medicine

## 2023-02-07 VITALS — BP 128/85 | HR 73 | Temp 98.4°F | Ht 66.0 in | Wt 169.0 lb

## 2023-02-07 DIAGNOSIS — Z0000 Encounter for general adult medical examination without abnormal findings: Secondary | ICD-10-CM | POA: Diagnosis not present

## 2023-02-07 DIAGNOSIS — E663 Overweight: Secondary | ICD-10-CM | POA: Diagnosis not present

## 2023-02-07 DIAGNOSIS — Z113 Encounter for screening for infections with a predominantly sexual mode of transmission: Secondary | ICD-10-CM

## 2023-02-07 DIAGNOSIS — Z124 Encounter for screening for malignant neoplasm of cervix: Secondary | ICD-10-CM | POA: Insufficient documentation

## 2023-02-07 DIAGNOSIS — Z1159 Encounter for screening for other viral diseases: Secondary | ICD-10-CM

## 2023-02-07 DIAGNOSIS — E782 Mixed hyperlipidemia: Secondary | ICD-10-CM

## 2023-02-07 DIAGNOSIS — I10 Essential (primary) hypertension: Secondary | ICD-10-CM

## 2023-02-07 DIAGNOSIS — Z1231 Encounter for screening mammogram for malignant neoplasm of breast: Secondary | ICD-10-CM

## 2023-02-07 DIAGNOSIS — E559 Vitamin D deficiency, unspecified: Secondary | ICD-10-CM

## 2023-02-07 NOTE — Progress Notes (Signed)
Patient ID: Evelyn Lawson, female    DOB: 1981/04/16  MRN: [id number]  CC: Annual Exam (Physical.)   Subjective: Evelyn Lawson is a 42 y.o. female who presents for annual exam Her concerns today include:  Patient with history of HTN, vitamin D insufficiency, HL, MNG/right dominant nodule (benign follicular changes on bx, ADHD (Dr. Marisue Brooklyn), migraines, mass RT breast (MMG through [facility] 06/2020.  Repeat in 6 mths)   GYN History:  Pt is G1P1 Any hx of abn paps?: no Menses regular or irregular?: regular How long does menses last?  5 days.  LNMP went off yesterday Menstrual flow light or heavy?:  moderate Method of birth control?:  tubal ligation 08/2021 Any vaginal dischg at this time?:  little Dysuria?:  no Any hx of STI?:  Gonorrhea 2010; treated Sexually active with how many partners:  one female partner Desires STI screen:  yes.  Would also like to be tested for HIV, syphilis and hepatitis C. Last MMG:  scheduled for 02/28/2023 Family hx of uterine, cervical or breast cancer?:   Breast CA 2 maternal aunts and GM.  Pt's mom was negative for BRCA.  Overwgh:   eating less but admits she has been snacking a lot - snackers, [employer], apple.  Trying to get back to a healthy diet.   Not moving as much as she should be.  Plans to start walking more  History of hyperlipidemia.  Last lipid profile was checked in November of last year.  Her LDL was 119.  ASCVD score is 0.5%. Request to have vitamin D level rechecked.  History of low vitamin D.  HTN:  taking Norvasc 10 mg and Propranolol 160 mg daily Checks BP once a wk.  Recent readings were:  114/76, 122/87, Tries not to use table salt  Patient Active Problem List   Diagnosis Date Noted   PMS (premenstrual syndrome) 03/02/2021   Mixed hyperlipidemia 11/30/2020   Stressful life event affecting family 11/30/2020   Thyroid nodule 11/30/2020   Multinodular goiter 09/25/2020   Encounter for sterilization 08/17/2020   Essential  hypertension 08/01/2020   Thyroid enlargement 08/01/2020   Vitamin D deficiency 08/01/2020   Influenza vaccination declined 08/01/2020   External hemorrhoids with irritation/pain 01/31/2014   Constipation, chronic 01/31/2014   Internal hemorrhoids with complication 01/31/2014     Current Outpatient Medications on File Prior to Visit  Medication Sig Dispense Refill   acetaminophen (TYLENOL) 325 MG tablet Take 650 mg by mouth every 6 (six) hours as needed (for pain.).     albuterol (VENTOLIN HFA) 108 (90 Base) MCG/ACT inhaler Inhale 2 puffs into the lungs every 6 (six) hours as needed for wheezing or shortness of breath. 18 g 2   Albuterol Sulfate (PROAIR RESPICLICK) 108 (90 Base) MCG/ACT AEPB Inhale 2 puffs into the lungs every 6 (six) hours. 1 each 0   amLODipine (NORVASC) 10 MG tablet Take 1 tablet (10 mg total) by mouth daily. 90 tablet 1   amoxicillin-clavulanate (AUGMENTIN) 500-125 MG tablet Take 1 tablet by mouth in the morning and at bedtime. 14 tablet 0   amphetamine-dextroamphetamine (ADDERALL) 10 MG tablet Take 20 mg by mouth daily.     benzonatate (TESSALON) 100 MG capsule Take 1 capsule (100 mg total) by mouth 3 (three) times daily as needed for cough. 30 capsule 0   famotidine (PEPCID) 20 MG tablet Take 1 tablet (20 mg total) by mouth 2 (two) times daily. 30 tablet 0   ibuprofen (ADVIL) 800  MG tablet Take 1 tablet (800 mg total) by mouth every 8 (eight) hours as needed. 30 tablet 0   Multiple Vitamin (MULTIVITAMIN WITH MINERALS) TABS tablet Take 1 tablet by mouth daily. Women's Multivitamin     omeprazole (PRILOSEC) 20 MG capsule Take 1 capsule (20 mg total) by mouth daily. 30 capsule 6   predniSONE (DELTASONE) 20 MG tablet Take 2 tablets (40 mg total) by mouth daily with breakfast. 10 tablet 0   predniSONE (STERAPRED UNI-PAK 21 TAB) 10 MG (21) TBPK tablet 6 day taper; take as directed on package instructions 21 tablet 0   promethazine (PHENERGAN) 12.5 MG tablet Take 1 tablet  (12.5 mg total) by mouth daily as needed for nausea or vomiting (to use for nausea during menstrual cycles). 30 tablet 0   propranolol ER (INDERAL LA) 160 MG SR capsule Take 1 capsule (160 mg total) by mouth daily. 90 capsule 1   sertraline (ZOLOFT) 50 MG tablet Take 1 tablet (50 mg total) by mouth daily. 90 tablet 2   traZODone (DESYREL) 50 MG tablet Take 0.5-1 tablets (25-50 mg total) by mouth at bedtime as needed for sleep. 60 tablet 2   triamcinolone cream (KENALOG) 0.1 % Apply 1 Application topically 2 (two) times daily. 60 g 0   No current facility-administered medications on file prior to visit.    Allergies  Allergen Reactions   Other Swelling and Rash    Peppers    Tomato Swelling and Rash    Paste is fine     Social History   Socioeconomic History   Marital status: Single    Spouse name: Not on file   Number of children: 1   Years of education: Not on file   Highest education level: Bachelor's degree (e.g., BA, AB, BS)  Occupational History   Occupation: HR specialist  Tobacco Use   Smoking status: Never   Smokeless tobacco: Never  Vaping Use   Vaping Use: Never used  Substance and Sexual Activity   Alcohol use: Yes    Comment: occasional   Drug use: Never   Sexual activity: Not on file  Other Topics Concern   Not on file  Social History Narrative   ** Merged History Encounter **       Social Determinants of Health   Financial Resource Strain: Low Risk  (12/23/2022)   Overall Financial Resource Strain (CARDIA)    Difficulty of Paying Living Expenses: Not very hard  Food Insecurity: No Food Insecurity (12/23/2022)   Hunger Vital Sign    Worried About Running Out of Food in the Last Year: Never true    Ran Out of Food in the Last Year: Never true  Transportation Needs: No Transportation Needs (12/23/2022)   PRAPARE - Administrator, Civil Service (Medical): No    Lack of Transportation (Non-Medical): No  Physical Activity: Unknown (12/23/2022)    Exercise Vital Sign    Days of Exercise per Week: 0 days    Minutes of Exercise per Session: Not on file  Stress: No Stress Concern Present (12/23/2022)   [employer] of Occupational Health - Occupational Stress Questionnaire    Feeling of Stress : Only a little  Social Connections: Socially Isolated (12/23/2022)   Social Connection and Isolation Panel [NHANES]    Frequency of Communication with Friends and Family: More than three times a week    Frequency of Social Gatherings with Friends and Family: More than three times a week    Attends  Religious Services: Never    Active Member of Clubs or Organizations: No    Attends Engineer, structural: Not on file    Marital Status: Never married  Intimate Partner Violence: Not on file    Family History  Problem Relation Age of Onset   Cancer Maternal Grandmother        breast   Breast cancer Maternal Grandmother    Cancer Paternal Grandmother        breast   Breast cancer Paternal Grandmother    Cancer Paternal Grandfather        colon   Diabetes Maternal Grandfather    Breast cancer Maternal Aunt    Thyroid disease Neg Hx     Past Surgical History:  Procedure Laterality Date   BREAST ENHANCEMENT SURGERY  11/30/2008   HEMORRHOID SURGERY  2013   LAPAROSCOPIC TUBAL LIGATION Bilateral 08/25/2020   Procedure: LAPAROSCOPIC TUBAL LIGATION WITH BIPOLAR CAUTERY;  Surgeon: Maxie Better, MD;  Location: [facility];  Service: Gynecology;  Laterality: Bilateral;    ROS: Review of Systems  HENT:  Negative for congestion, dental problem, hearing loss and trouble swallowing.        Has regular dental appointments at least twice a year for cleaning.  Eyes:        Wears contacts and glasses.  Has an upcoming eye appointment.  Respiratory:  Negative for cough and shortness of breath.   Cardiovascular:  Negative for chest pain.  Gastrointestinal:        Moving bowels okay.  She has not had any further gallbladder  attacks.  Genitourinary:  Negative for difficulty urinating.  Skin:        Feet gets cold sometimes.     PHYSICAL EXAM: BP 128/85 (BP Location: Left Arm, Patient Position: Sitting, Cuff Size: Normal)   Pulse 73   Temp 98.4 F (36.9 C) (Oral)   Ht 5\' 6"  (1.676 m)   Wt 169 lb (76.7 kg)   SpO2 100%   BMI 27.28 kg/m   Wt Readings from Last 3 Encounters:  02/07/23 169 lb (76.7 kg)  12/23/22 170 lb 3.2 oz (77.2 kg)  08/05/22 178 lb (80.7 kg)    Physical Exam  General appearance - alert, well appearing, and in no distress Mental status - normal mood, behavior, speech, dress, motor activity, and thought processes Eyes -nonicteric sclera, pink conjunctiva. + arcus senilis Ears - bilateral TM's and external ear canals normal Nose - normal and patent, no erythema, discharge or polyps Mouth - mucous membranes moist, pharynx normal without lesions Neck - supple, no significant adenopathy Lymphatics - no palpable lymphadenopathy, no hepatosplenomegaly Chest - clear to auscultation, no wheezes, rales or rhonchi, symmetric air entry Heart - normal rate, regular rhythm, normal S1, S2, no murmurs, rubs, clicks or gallops Abdomen - soft, nontender, nondistended, no masses or organomegaly Breasts - pt declined since she has MMG coming up Pelvic - CMA Clarissa present:  normal external genitalia, vulva, vagina, cervix, uterus and adnexa Extremities - peripheral pulses normal, no pedal edema, no clubbing or cyanosis Skin - normal coloration and turgor, no rashes, no suspicious skin lesions noted    02/07/2023   11:00 AM 12/23/2022    2:49 PM 08/05/2022   10:50 AM  Depression screen PHQ 2/9  Decreased Interest 1 0 0  Down, Depressed, Hopeless 1 0 0  PHQ - 2 Score 2 0 0  Altered sleeping 1 0 0  Tired, decreased energy 1 0 1  Change in appetite 1 0 1  Feeling bad or failure about yourself  1 0 0  Trouble concentrating 1 0 1  Moving slowly or fidgety/restless 1 1 1   Suicidal thoughts 0 0 0   PHQ-9 Score 8 1 4     The 10-year ASCVD risk score (Arnett DK, et al., 2019) is: 0.5%   Values used to calculate the score:     Age: 37 years     Sex: Female     Is Non-Hispanic African American: Yes     Diabetic: No     Tobacco smoker: No     Systolic Blood Pressure: 128 mmHg     Is BP treated: Yes     HDL Cholesterol: 96 mg/dL     Total Cholesterol: 227 mg/dL     Latest Ref Rng & Units 08/05/2022   11:32 AM 08/02/2021    2:40 PM 08/23/2020   10:43 AM  CMP  Glucose 70 - 99 mg/dL 96  76  96   BUN 6 - 24 mg/dL 13  9  7    Creatinine 0.57 - 1.00 mg/dL 8.29  5.62  1.30   Sodium 134 - 144 mmol/L 138  139  137   Potassium 3.5 - 5.2 mmol/L 4.1  4.2  3.8   Chloride 96 - 106 mmol/L 99  98  103   CO2 20 - 29 mmol/L 25  26  27    Calcium 8.7 - 10.2 mg/dL 9.9  9.7  9.0   Total Protein 6.0 - 8.5 g/dL 7.3  7.6    Total Bilirubin 0.0 - 1.2 mg/dL 0.6  0.4    Alkaline Phos 44 - 121 IU/L 68  63    AST 0 - 40 IU/L 18  13    ALT 0 - 32 IU/L 13  8     Lipid Panel     Component Value Date/Time   CHOL 227 (H) 08/05/2022 1132   TRIG 72 08/05/2022 1132   HDL 96 08/05/2022 1132   CHOLHDL 2.4 08/05/2022 1132   LDLCALC 119 (H) 08/05/2022 1132    CBC    Component Value Date/Time   WBC 6.0 08/05/2022 1132   WBC 5.8 08/25/2020 1222   RBC 4.41 08/05/2022 1132   RBC 4.79 08/25/2020 1222   HGB 13.9 08/05/2022 1132   HCT 41.6 08/05/2022 1132   PLT 323 08/05/2022 1132   MCV 94 08/05/2022 1132   MCH 31.5 08/05/2022 1132   MCH 32.2 08/25/2020 1222   MCHC 33.4 08/05/2022 1132   MCHC 32.2 08/25/2020 1222   RDW 11.7 08/05/2022 1132   LYMPHSABS 1.9 08/02/2021 1440   EOSABS 0.2 08/02/2021 1440   BASOSABS 0.1 08/02/2021 1440    ASSESSMENT AND PLAN: 1. Annual physical exam  2. Pap smear for cervical cancer screening - Cytology - PAP - Cervicovaginal ancillary only  3. Over weight Patient advised to eliminate sugary drinks from the diet, cut back on portion sizes especially of white  carbohydrates, eat more white lean meat like chicken [place] and seafood instead of beef or pork and incorporate fresh fruits and vegetables into the diet daily. Encouraged her to choose healthier snacks like fruits or nuts. Encouraged her to get in some form of moderate intensity exercise with goal of at least 30 minutes 5 days a week.  4. Mixed hyperlipidemia ASCVD score is not at the level where we need to start statin therapy.  Lifestyle changes recommended.  See #3 above.  5. Vitamin  D deficiency - VITAMIN D 25 Hydroxy (Vit-D Deficiency, Fractures)  6. Essential hypertension Close to goal.  Continue amlodipine 10 mg daily and propranolol.  7. Encounter for screening mammogram for malignant neoplasm of breast Keep appointment for upcoming mammogram.  8. Routine screening for STI (sexually transmitted infection) - RPR w/reflex to TrepSure - HIV antibody (with reflex)  9. Need for hepatitis C screening test - Hepatitis C Antibody     Patient was given the opportunity to ask questions.  Patient verbalized understanding of the plan and was able to repeat key elements of the plan.   This documentation was completed using Paediatric nurse.  Any transcriptional errors are unintentional.  No orders of the defined types were placed in this encounter.    Requested Prescriptions    No prescriptions requested or ordered in this encounter    No follow-ups on file.  Jonah Blue, MD, FACP

## 2023-02-07 NOTE — Patient Instructions (Signed)
Preventive Care 40-41 Years Old, Female Preventive care refers to lifestyle choices and visits with your health care provider that can promote health and wellness. Preventive care visits are also called wellness exams. What can I expect for my preventive care visit? Counseling Your health care provider may ask you questions about your: Medical history, including: Past medical problems. Family medical history. Pregnancy history. Current health, including: Menstrual cycle. Method of birth control. Emotional well-being. Home life and relationship well-being. Sexual activity and sexual health. Lifestyle, including: Alcohol, nicotine or tobacco, and drug use. Access to firearms. Diet, exercise, and sleep habits. Work and work environment. Sunscreen use. Safety issues such as seatbelt and bike helmet use. Physical exam Your health care provider will check your: Height and weight. These may be used to calculate your BMI (body mass index). BMI is a measurement that tells if you are at a healthy weight. Waist circumference. This measures the distance around your waistline. This measurement also tells if you are at a healthy weight and may help predict your risk of certain diseases, such as type 2 diabetes and high blood pressure. Heart rate and blood pressure. Body temperature. Skin for abnormal spots. What immunizations do I need?  Vaccines are usually given at various ages, according to a schedule. Your health care provider will recommend vaccines for you based on your age, medical history, and lifestyle or other factors, such as travel or where you work. What tests do I need? Screening Your health care provider may recommend screening tests for certain conditions. This may include: Lipid and cholesterol levels. Diabetes screening. This is done by checking your blood sugar (glucose) after you have not eaten for a while (fasting). Pelvic exam and Pap test. Hepatitis B test. Hepatitis C  test. HIV (human immunodeficiency virus) test. STI (sexually transmitted infection) testing, if you are at risk. Lung cancer screening. Colorectal cancer screening. Mammogram. Talk with your health care provider about when you should start having regular mammograms. This may depend on whether you have a family history of breast cancer. BRCA-related cancer screening. This may be done if you have a family history of breast, ovarian, tubal, or peritoneal cancers. Bone density scan. This is done to screen for osteoporosis. Talk with your health care provider about your test results, treatment options, and if necessary, the need for more tests. Follow these instructions at home: Eating and drinking  Eat a diet that includes fresh fruits and vegetables, whole grains, lean protein, and low-fat dairy products. Take vitamin and mineral supplements as recommended by your health care provider. Do not drink alcohol if: Your health care provider tells you not to drink. You are pregnant, may be pregnant, or are planning to become pregnant. If you drink alcohol: Limit how much you have to 0-1 drink a day. Know how much alcohol is in your drink. In the [place] one drink equals one 12 oz bottle of beer (355 mL), one 5 oz glass of wine (148 mL), or one 1 oz glass of hard liquor (44 mL). Lifestyle Brush your teeth every morning and night with fluoride toothpaste. Floss one time each day. Exercise for at least 30 minutes 5 or more days each week. Do not use any products that contain nicotine or tobacco. These products include cigarettes, chewing tobacco, and vaping devices, such as e-cigarettes. If you need help quitting, ask your health care provider. Do not use drugs. If you are sexually active, practice safe sex. Use a condom or other form of protection to   prevent STIs. If you do not wish to become pregnant, use a form of birth control. If you plan to become pregnant, see your health care provider for a  prepregnancy visit. Take aspirin only as told by your health care provider. Make sure that you understand how much to take and what form to take. Work with your health care provider to find out whether it is safe and beneficial for you to take aspirin daily. Find healthy ways to manage stress, such as: Meditation, yoga, or listening to music. Journaling. Talking to a trusted person. Spending time with friends and family. Minimize exposure to UV radiation to reduce your risk of skin cancer. Safety Always wear your seat belt while driving or riding in a vehicle. Do not drive: If you have been drinking alcohol. Do not ride with someone who has been drinking. When you are tired or distracted. While texting. If you have been using any mind-altering substances or drugs. Wear a helmet and other protective equipment during sports activities. If you have firearms in your house, make sure you follow all gun safety procedures. Seek help if you have been physically or sexually abused. What's next? Visit your health care provider once a year for an annual wellness visit. Ask your health care provider how often you should have your eyes and teeth checked. Stay up to date on all vaccines. This information is not intended to replace advice given to you by your health care provider. Make sure you discuss any questions you have with your health care provider. Document Revised: 03/07/2021 Document Reviewed: 03/07/2021 Elsevier Patient Education  2023 Elsevier Inc.  

## 2023-02-10 LAB — HIV ANTIBODY (ROUTINE TESTING W REFLEX): HIV Screen 4th Generation wRfx: NONREACTIVE

## 2023-02-10 LAB — RPR W/REFLEX TO TREPSURE: RPR: NONREACTIVE

## 2023-02-10 LAB — HEPATITIS C ANTIBODY: Hep C Virus Ab: NONREACTIVE

## 2023-02-10 LAB — T PALLIDUM ANTIBODY, EIA: T pallidum Antibody, EIA: NEGATIVE

## 2023-02-10 LAB — VITAMIN D 25 HYDROXY (VIT D DEFICIENCY, FRACTURES): Vit D, 25-Hydroxy: 26.3 ng/mL — ABNORMAL LOW (ref 30.0–100.0)

## 2023-02-11 ENCOUNTER — Other Ambulatory Visit: Payer: Self-pay | Admitting: Internal Medicine

## 2023-02-11 LAB — CERVICOVAGINAL ANCILLARY ONLY
Bacterial Vaginitis (gardnerella): POSITIVE — AB
Candida Glabrata: NEGATIVE
Candida Vaginitis: NEGATIVE
Chlamydia: NEGATIVE
Comment: NEGATIVE
Comment: NEGATIVE
Comment: NEGATIVE
Comment: NEGATIVE
Comment: NEGATIVE
Comment: NORMAL
Neisseria Gonorrhea: NEGATIVE
Trichomonas: NEGATIVE

## 2023-02-11 MED ORDER — METRONIDAZOLE 500 MG PO TABS
500.0000 mg | ORAL_TABLET | Freq: Two times a day (BID) | ORAL | 0 refills | Status: DC
Start: 2023-02-11 — End: 2023-04-15

## 2023-02-14 LAB — CYTOLOGY - PAP
Comment: NEGATIVE
Diagnosis: NEGATIVE
Diagnosis: REACTIVE
High risk HPV: NEGATIVE

## 2023-03-14 LAB — HM MAMMOGRAPHY: HM Mammogram: NORMAL (ref 0–4)

## 2023-03-19 ENCOUNTER — Encounter: Payer: Self-pay | Admitting: Internal Medicine

## 2023-03-19 NOTE — Progress Notes (Signed)
I received report from mammogram from [facility] mammography.  Date of exam was 03/14/2023 and results normal.

## 2023-04-15 ENCOUNTER — Other Ambulatory Visit: Payer: Self-pay

## 2023-04-15 ENCOUNTER — Encounter (HOSPITAL_COMMUNITY): Payer: Self-pay | Admitting: General Surgery

## 2023-04-15 NOTE — Progress Notes (Signed)
Attempted to obtain medical history via telephone, unable to reach at this time. HIPAA compliant voicemail message left requesting return call to pre surgical testing department. 

## 2023-04-15 NOTE — Progress Notes (Addendum)
For Anesthesia: PCP - Marcine Matar, MD  Cardiologist - N/A Endocrinology- Romero Belling, MD  [facility] Attention Specialist  Chest x-ray - greater than 1 year in [facility] EKG - greater than 1 year in [facility] Stress Test - N/A ECHO - greater than 2 years in [facility] Cardiac Cath - N/A Pacemaker/ICD device last checked: N/A Pacemaker orders received:N/A Device Rep notified:N/A  Spinal Cord Stimulator:N/A  Sleep Study - N/A CPAP - N/A  Fasting Blood Sugar - N/A Checks Blood Sugar __N/A___ times a day Date and result of last Hgb A1c-N/A  Last dose of GLP1 agonist- N/A GLP1 instructions: N/A  Last dose of SGLT-2 inhibitors- N/A SGLT-2 instructions:N/A  Blood Thinner Instructions: N/A Aspirin Instructions:N/A Last Dose:N/A  Activity level:  Able to exercise without chest pain and/or shortness of breath     Anesthesia review: Heart murmur  Patient denies shortness of breath, fever, cough and chest pain during pre op phone call.   Patient verbalized understanding of instructions reviewed via telephone.

## 2023-04-15 NOTE — Progress Notes (Signed)
Case: [id number] Date/Time: 04/17/23 1445   Procedure: LAPAROSCOPIC CHOLECYSTECTOMY   Anesthesia type: General   Pre-op diagnosis: SYMPTOMATIC CHOLELITHIASIS   Location: [facility] ROOM 03 / [facility]   Surgeons: Gaynelle Adu, MD       DISCUSSION: Evelyn Lawson is a 42 year old female who is being evaluated prior to surgery above.  Past medical history significant for biliary colic, hypertension, GERD, heart murmur.  Patient follows closely with her PCP.  Last seen on 02/07/2023.  All medical issues appear to be stable.  Patient with history of heart murmur.  Echo was obtained in 2005 which was normal and did not show any significant valvular disease with a normal EF.  VS: Ht 5\' 6"  (1.676 m)   Wt 77.1 kg   LMP 03/28/2023   BMI 27.44 kg/m   Wt Readings from Last 3 Encounters:  04/15/23 77.1 kg  02/07/23 76.7 kg  12/23/22 77.2 kg   Temp Readings from Last 3 Encounters:  02/07/23 36.9 C (Oral)  08/05/22 36.8 C (Oral)  08/25/20 (!) 36.4 C   BP Readings from Last 3 Encounters:  02/07/23 128/85  12/23/22 (!) 135/90  08/05/22 110/82   Pulse Readings from Last 3 Encounters:  02/07/23 73  12/23/22 98  08/05/22 74     PROVIDERS: Marcine Matar, MD   LABS: Obtain DOS (all labs ordered are listed, but only abnormal results are displayed)  Labs Reviewed - No data to display   IMAGES:  RUQ [place] 08/20/2022:  IMPRESSION: Cholelithiasis without secondary signs of acute cholecystitis.   EKG:   CV:  Echo 08/17/2004  SUMMARY - Overall left ventricular systolic function was normal. Left ventricular ejection fraction was estimated , range being 55 % to 65 %.. Left ventricular diastolic function parameters were normal. - Normal 2D echo. No valvular disease noted. IMPRESSIONS - Normal 2D echo. No valvular disease noted. - Normal echocardiographic study.   Past Medical History:  Diagnosis Date   ADHD (attention deficit hyperactivity disorder)    Enlarged thyroid     ultasound in epic 08-07-2020  bilateral nodules   (08-16-2020 per pt pcp has referred her to an endocrinologist)   GERD (gastroesophageal reflux disease)    Heart murmur    Hemorrhoids    History of pneumonia    History of seizures as a child    last febrile seizure age 49   Hypertension    followed by pcp   Migraines    Numbness and tingling of both feet    Social anxiety disorder    Thyroid nodule    Wears contact lenses     Past Surgical History:  Procedure Laterality Date   BREAST ENHANCEMENT SURGERY  11/30/2008   HEMORRHOID SURGERY  2013   LAPAROSCOPIC TUBAL LIGATION Bilateral 08/25/2020   Procedure: LAPAROSCOPIC TUBAL LIGATION WITH BIPOLAR CAUTERY;  Surgeon: Maxie Better, MD;  Location: [facility] [place];  Service: Gynecology;  Laterality: Bilateral;   WISDOM TOOTH EXTRACTION      MEDICATIONS: No current facility-administered medications for this encounter.    acetaminophen (TYLENOL) 500 MG tablet   amLODipine (NORVASC) 10 MG tablet   amphetamine-dextroamphetamine (ADDERALL XR) 25 MG 24 hr capsule   amphetamine-dextroamphetamine (ADDERALL) 20 MG tablet   famotidine (PEPCID) 20 MG tablet   Multiple Vitamin (MULTIVITAMIN WITH MINERALS) TABS tablet   omeprazole (PRILOSEC) 20 MG capsule   propranolol ER (INDERAL LA) 160 MG SR capsule   sertraline (ZOLOFT) 50 MG tablet   Ubaldo Glassing, PA-C [facility]  Short Stay/Anesthesiology [facility] Phone [phone] 04/15/2023 3:41 PM

## 2023-04-15 NOTE — Anesthesia Preprocedure Evaluation (Signed)
Anesthesia Evaluation  Patient identified by MRN, date of birth, ID band Patient awake    Reviewed: Allergy & Precautions, NPO status , Patient's Chart, lab work & pertinent test results  History of Anesthesia Complications Negative for: history of anesthetic complications  Airway Mallampati: II  TM Distance: >3 FB Neck ROM: Full    Dental  (+) Dental Advisory Given   Pulmonary neg pulmonary ROS   Pulmonary exam normal        Cardiovascular hypertension, Pt. on medications and Pt. on home beta blockers Normal cardiovascular exam     Neuro/Psych  Headaches, Seizures - (as child),  PSYCHIATRIC DISORDERS Anxiety        GI/Hepatic Neg liver ROS,GERD  Medicated,,  Endo/Other  negative endocrine ROS    Renal/GU negative Renal ROS     Musculoskeletal negative musculoskeletal ROS (+)    Abdominal   Peds  (+) ADHD Hematology negative hematology ROS (+)   Anesthesia Other Findings   Reproductive/Obstetrics                              Anesthesia Physical Anesthesia Plan  ASA: 2  Anesthesia Plan: General   Post-op Pain Management: Tylenol PO (pre-op)* and Toradol IV (intra-op)*   Induction: Intravenous  PONV Risk Score and Plan: 3 and Treatment may vary due to age or medical condition, Ondansetron, Dexamethasone and Midazolam  Airway Management Planned: Oral ETT  Additional Equipment: None  Intra-op Plan:   Post-operative Plan: Extubation in OR  Informed Consent: I have reviewed the patients History and Physical, chart, labs and discussed the procedure including the risks, benefits and alternatives for the proposed anesthesia with the patient or authorized representative who has indicated his/her understanding and acceptance.     Dental advisory given  Plan Discussed with: CRNA and Anesthesiologist  Anesthesia Plan Comments: (See PAT note)         Anesthesia Quick  Evaluation

## 2023-04-17 ENCOUNTER — Encounter (HOSPITAL_COMMUNITY)
Admission: RE | Disposition: A | Discharge status: Home / Self Care | Payer: Self-pay | Source: Home / Self Care | Attending: General Surgery

## 2023-04-17 ENCOUNTER — Ambulatory Visit (HOSPITAL_COMMUNITY): Payer: BC Managed Care – PPO | Admitting: Medical

## 2023-04-17 ENCOUNTER — Encounter (HOSPITAL_COMMUNITY): Payer: Self-pay | Admitting: General Surgery

## 2023-04-17 ENCOUNTER — Other Ambulatory Visit: Payer: Self-pay

## 2023-04-17 ENCOUNTER — Ambulatory Visit (HOSPITAL_COMMUNITY)
Admission: RE | Admit: 2023-04-17 | Discharge: 2023-04-17 | Disposition: A | Discharge status: Home / Self Care | Payer: BC Managed Care – PPO | Attending: General Surgery | Admitting: General Surgery

## 2023-04-17 ENCOUNTER — Ambulatory Visit: Payer: Self-pay | Admitting: General Surgery

## 2023-04-17 DIAGNOSIS — K8010 Calculus of gallbladder with chronic cholecystitis without obstruction: Secondary | ICD-10-CM | POA: Diagnosis not present

## 2023-04-17 DIAGNOSIS — Z8669 Personal history of other diseases of the nervous system and sense organs: Secondary | ICD-10-CM | POA: Insufficient documentation

## 2023-04-17 DIAGNOSIS — I10 Essential (primary) hypertension: Secondary | ICD-10-CM

## 2023-04-17 DIAGNOSIS — Z01818 Encounter for other preprocedural examination: Secondary | ICD-10-CM

## 2023-04-17 DIAGNOSIS — K8020 Calculus of gallbladder without cholecystitis without obstruction: Secondary | ICD-10-CM | POA: Diagnosis present

## 2023-04-17 HISTORY — DX: Personal history of pneumonia (recurrent): Z87.01

## 2023-04-17 HISTORY — DX: Migraine, unspecified, not intractable, without status migrainosus: G43.909

## 2023-04-17 HISTORY — DX: Cardiac murmur, unspecified: R01.1

## 2023-04-17 HISTORY — DX: Nontoxic single thyroid nodule: E04.1

## 2023-04-17 HISTORY — DX: Anesthesia of skin: R20.0

## 2023-04-17 HISTORY — PX: CHOLECYSTECTOMY: SHX55

## 2023-04-17 HISTORY — DX: Gastro-esophageal reflux disease without esophagitis: K21.9

## 2023-04-17 LAB — BASIC METABOLIC PANEL
Anion gap: 9 (ref 5–15)
BUN: 11 mg/dL (ref 6–20)
CO2: 25 mmol/L (ref 22–32)
Calcium: 9.2 mg/dL (ref 8.9–10.3)
Chloride: 104 mmol/L (ref 98–111)
Creatinine, Ser: 0.87 mg/dL (ref 0.44–1.00)
GFR, Estimated: >60 mL/min (ref >60)
Glucose, Bld: 89 mg/dL (ref 70–99)
Potassium: 3.3 mmol/L — ABNORMAL LOW (ref 3.5–5.1)
Sodium: 138 mmol/L (ref 135–145)

## 2023-04-17 LAB — POCT PREGNANCY, URINE: Preg Test, Ur: NEGATIVE

## 2023-04-17 SURGERY — LAPAROSCOPIC CHOLECYSTECTOMY
Anesthesia: General

## 2023-04-17 MED ORDER — ACETAMINOPHEN 500 MG PO TABS: 500.0000 mg | ORAL_TABLET | Freq: Three times a day (TID) | ORAL | Status: AC

## 2023-04-17 MED ORDER — FENTANYL CITRATE (PF) 100 MCG/2ML IJ SOLN
INTRAMUSCULAR | Status: DC | PRN
  Administered 2023-04-17: 50 ug via INTRAVENOUS

## 2023-04-17 MED ORDER — ONDANSETRON HCL 4 MG/2ML IJ SOLN
INTRAMUSCULAR | Status: DC | PRN
Start: 2023-04-17 — End: 2023-04-17
  Administered 2023-04-17: 4 mg via INTRAVENOUS

## 2023-04-17 MED ORDER — ROCURONIUM BROMIDE 10 MG/ML (PF) SYRINGE
PREFILLED_SYRINGE | INTRAVENOUS | Status: DC | PRN
  Administered 2023-04-17: 60 mg via INTRAVENOUS

## 2023-04-17 MED ORDER — FENTANYL CITRATE PF 50 MCG/ML IJ SOSY
PREFILLED_SYRINGE | INTRAMUSCULAR | Status: AC
  Filled 2023-04-17: qty 2

## 2023-04-17 MED ORDER — KETOROLAC TROMETHAMINE 30 MG/ML IJ SOLN
INTRAMUSCULAR | Status: AC
  Filled 2023-04-17: qty 1

## 2023-04-17 MED ORDER — LIDOCAINE 2% (20 MG/ML) 5 ML SYRINGE
INTRAMUSCULAR | Status: DC | PRN
  Administered 2023-04-17: 60 mg via INTRAVENOUS

## 2023-04-17 MED ORDER — 0.9 % SODIUM CHLORIDE (POUR BTL) OPTIME
TOPICAL | Status: DC | PRN
  Administered 2023-04-17: 1000 mL

## 2023-04-17 MED ORDER — ORAL CARE MOUTH RINSE: 15.0000 mL | Freq: Once | OROMUCOSAL | Status: AC

## 2023-04-17 MED ORDER — SPY AGENT GREEN - (INDOCYANINE FOR INJECTION)
1.2500 mg | Freq: Once | INTRAMUSCULAR | Status: AC
  Administered 2023-04-17: 1.25 mg via INTRAVENOUS
  Filled 2023-04-17: qty 10

## 2023-04-17 MED ORDER — DEXAMETHASONE SODIUM PHOSPHATE 10 MG/ML IJ SOLN
INTRAMUSCULAR | Status: AC
  Filled 2023-04-17: qty 1

## 2023-04-17 MED ORDER — ROCURONIUM BROMIDE 10 MG/ML (PF) SYRINGE
PREFILLED_SYRINGE | INTRAVENOUS | Status: AC
  Filled 2023-04-17: qty 10

## 2023-04-17 MED ORDER — MIDAZOLAM HCL 5 MG/5ML IJ SOLN
INTRAMUSCULAR | Status: DC | PRN
  Administered 2023-04-17: 2 mg via INTRAVENOUS

## 2023-04-17 MED ORDER — LACTATED RINGERS IV SOLN: INTRAVENOUS | Status: DC

## 2023-04-17 MED ORDER — SODIUM CHLORIDE 0.9 % IV SOLN
2.0000 g | INTRAVENOUS | Status: AC
  Administered 2023-04-17: 2 g via INTRAVENOUS
  Filled 2023-04-17: qty 2

## 2023-04-17 MED ORDER — SUGAMMADEX SODIUM 200 MG/2ML IV SOLN
INTRAVENOUS | Status: DC | PRN
  Administered 2023-04-17: 200 mg via INTRAVENOUS

## 2023-04-17 MED ORDER — FENTANYL CITRATE (PF) 100 MCG/2ML IJ SOLN
INTRAMUSCULAR | Status: AC
  Filled 2023-04-17: qty 2

## 2023-04-17 MED ORDER — LIDOCAINE HCL (PF) 2 % IJ SOLN
INTRAMUSCULAR | Status: AC
  Filled 2023-04-17: qty 5

## 2023-04-17 MED ORDER — OXYCODONE HCL 5 MG PO TABS
5.0000 mg | ORAL_TABLET | Freq: Once | ORAL | Status: AC | PRN
  Administered 2023-04-17: 5 mg via ORAL

## 2023-04-17 MED ORDER — PROPOFOL 10 MG/ML IV BOLUS
INTRAVENOUS | Status: DC | PRN
Start: 2023-04-17 — End: 2023-04-17
  Administered 2023-04-17: 200 mg via INTRAVENOUS

## 2023-04-17 MED ORDER — PHENYLEPHRINE 80 MCG/ML (10ML) SYRINGE FOR IV PUSH (FOR BLOOD PRESSURE SUPPORT)
PREFILLED_SYRINGE | INTRAVENOUS | Status: AC
  Filled 2023-04-17: qty 10

## 2023-04-17 MED ORDER — ONDANSETRON HCL 4 MG/2ML IJ SOLN
INTRAMUSCULAR | Status: AC
  Filled 2023-04-17: qty 2

## 2023-04-17 MED ORDER — PROMETHAZINE HCL 25 MG/ML IJ SOLN: 6.2500 mg | INTRAMUSCULAR | Status: DC | PRN

## 2023-04-17 MED ORDER — BUPIVACAINE-EPINEPHRINE 0.25% -1:200000 IJ SOLN
INTRAMUSCULAR | Status: AC
  Filled 2023-04-17: qty 1

## 2023-04-17 MED ORDER — PHENYLEPHRINE 80 MCG/ML (10ML) SYRINGE FOR IV PUSH (FOR BLOOD PRESSURE SUPPORT)
PREFILLED_SYRINGE | INTRAVENOUS | Status: DC | PRN
  Administered 2023-04-17: 160 ug via INTRAVENOUS

## 2023-04-17 MED ORDER — KETOROLAC TROMETHAMINE 30 MG/ML IJ SOLN
INTRAMUSCULAR | Status: DC | PRN
  Administered 2023-04-17: 30 mg via INTRAVENOUS

## 2023-04-17 MED ORDER — PROPOFOL 10 MG/ML IV BOLUS
INTRAVENOUS | Status: AC
  Filled 2023-04-17: qty 20

## 2023-04-17 MED ORDER — ACETAMINOPHEN 500 MG PO TABS
1000.0000 mg | ORAL_TABLET | ORAL | Status: AC
  Administered 2023-04-17: 1000 mg via ORAL
  Filled 2023-04-17: qty 2

## 2023-04-17 MED ORDER — CHLORHEXIDINE GLUCONATE 0.12 % MT SOLN
15.0000 mL | Freq: Once | OROMUCOSAL | Status: AC
  Administered 2023-04-17: 15 mL via OROMUCOSAL

## 2023-04-17 MED ORDER — OXYCODONE HCL 5 MG/5ML PO SOLN: 5.0000 mg | Freq: Once | ORAL | Status: AC | PRN

## 2023-04-17 MED ORDER — LACTATED RINGERS IR SOLN
Status: DC | PRN
  Administered 2023-04-17: 1000 mL

## 2023-04-17 MED ORDER — CHLORHEXIDINE GLUCONATE CLOTH 2 % EX PADS: 6.0000 | MEDICATED_PAD | Freq: Once | CUTANEOUS | Status: DC

## 2023-04-17 MED ORDER — FENTANYL CITRATE PF 50 MCG/ML IJ SOSY
25.0000 ug | PREFILLED_SYRINGE | INTRAMUSCULAR | Status: DC | PRN
  Administered 2023-04-17 (×2): 50 ug via INTRAVENOUS

## 2023-04-17 MED ORDER — MIDAZOLAM HCL 2 MG/2ML IJ SOLN
INTRAMUSCULAR | Status: AC
  Filled 2023-04-17: qty 2

## 2023-04-17 MED ORDER — BUPIVACAINE-EPINEPHRINE 0.25% -1:200000 IJ SOLN
INTRAMUSCULAR | Status: DC | PRN
  Administered 2023-04-17: 25 mL

## 2023-04-17 MED ORDER — OXYCODONE HCL 5 MG PO TABS
ORAL_TABLET | ORAL | Status: AC
  Filled 2023-04-17: qty 1

## 2023-04-17 MED ORDER — OXYCODONE HCL 5 MG PO TABS: 5.0000 mg | ORAL_TABLET | Freq: Four times a day (QID) | ORAL | 0 refills | Status: DC | PRN

## 2023-04-17 MED ORDER — DEXAMETHASONE SODIUM PHOSPHATE 10 MG/ML IJ SOLN
INTRAMUSCULAR | Status: DC | PRN
  Administered 2023-04-17: 10 mg via INTRAVENOUS

## 2023-04-17 SURGICAL SUPPLY — 56 items
ADH SKN CLS APL DERMABOND .7 (GAUZE/BANDAGES/DRESSINGS)
APL PRP STRL LF DISP 70% ISPRP (MISCELLANEOUS) ×1
APL SRG 38 LTWT LNG FL B (MISCELLANEOUS)
APPLICATOR ARISTA FLEXITIP XL (MISCELLANEOUS) IMPLANT
APPLIER CLIP 5 13 M/L LIGAMAX5 (MISCELLANEOUS)
APPLIER CLIP ROT 10 11.4 M/L (STAPLE)
APR CLP MED LRG 11.4X10 (STAPLE)
APR CLP MED LRG 5 ANG JAW (MISCELLANEOUS)
BAG COUNTER SPONGE SURGICOUNT (BAG) IMPLANT
BAG SPEC RTRVL 10 TROC 200 (ENDOMECHANICALS) ×1
BAG SPNG CNTER NS LX DISP (BAG)
CABLE HIGH FREQUENCY MONO STRZ (ELECTRODE) ×1 IMPLANT
CHLORAPREP W/TINT 26 (MISCELLANEOUS) ×1 IMPLANT
CLIP APPLIE 5 13 M/L LIGAMAX5 (MISCELLANEOUS) IMPLANT
CLIP APPLIE ROT 10 11.4 M/L (STAPLE) IMPLANT
CLIP LIGATING HEMO O LOK GREEN (MISCELLANEOUS) IMPLANT
COVER MAYO STAND XLG (MISCELLANEOUS) IMPLANT
COVER SURGICAL LIGHT HANDLE (MISCELLANEOUS) ×1 IMPLANT
DERMABOND ADVANCED .7 DNX12 (GAUZE/BANDAGES/DRESSINGS) IMPLANT
DRAPE C-ARM 42X120 X-RAY (DRAPES) IMPLANT
DRSG TEGADERM 2-3/8X2-3/4 SM (GAUZE/BANDAGES/DRESSINGS) ×3 IMPLANT
DRSG TEGADERM 4X4.75 (GAUZE/BANDAGES/DRESSINGS) ×1 IMPLANT
ELECT REM PT RETURN 15FT ADLT (MISCELLANEOUS) ×1 IMPLANT
GAUZE SPONGE 2X2 8PLY STRL LF (GAUZE/BANDAGES/DRESSINGS) ×1 IMPLANT
GLOVE BIO SURGEON STRL SZ7.5 (GLOVE) ×1 IMPLANT
GLOVE INDICATOR 8.0 STRL GRN (GLOVE) ×1 IMPLANT
GOWN STRL REUS W/ TWL XL LVL3 (GOWN DISPOSABLE) ×1 IMPLANT
GOWN STRL REUS W/TWL XL LVL3 (GOWN DISPOSABLE) ×1
GRASPER SUT TROCAR 14GX15 (MISCELLANEOUS) IMPLANT
HEMOSTAT ARISTA ABSORB 3G PWDR (HEMOSTASIS) IMPLANT
HEMOSTAT SNOW SURGICEL 2X4 (HEMOSTASIS) IMPLANT
IRRIG SUCT STRYKERFLOW 2 WTIP (MISCELLANEOUS) ×1
IRRIGATION SUCT STRKRFLW 2 WTP (MISCELLANEOUS) ×1 IMPLANT
KIT BASIN OR (CUSTOM PROCEDURE TRAY) ×1 IMPLANT
KIT TURNOVER KIT A (KITS) IMPLANT
L-HOOK LAP DISP 36CM (ELECTROSURGICAL)
LHOOK LAP DISP 36CM (ELECTROSURGICAL) IMPLANT
POUCH RETRIEVAL ECOSAC 10 (ENDOMECHANICALS) ×1 IMPLANT
SCISSORS LAP 5X35 DISP (ENDOMECHANICALS) ×1 IMPLANT
SET CHOLANGIOGRAPH MIX (MISCELLANEOUS) IMPLANT
SET TUBE SMOKE EVAC HIGH FLOW (TUBING) ×1 IMPLANT
SLEEVE ADV FIXATION 5X100MM (TROCAR) ×1 IMPLANT
SPIKE FLUID TRANSFER (MISCELLANEOUS) ×1 IMPLANT
STRIP CLOSURE SKIN 1/2X4 (GAUZE/BANDAGES/DRESSINGS) ×1 IMPLANT
SUT MNCRL AB 4-0 PS2 18 (SUTURE) ×1 IMPLANT
SUT VIC AB 0 UR5 27 (SUTURE) IMPLANT
SUT VIC AB 3-0 SH 18 (SUTURE) IMPLANT
SUT VICRYL 0 TIES 12 18 (SUTURE) IMPLANT
SUT VICRYL 0 UR6 27IN ABS (SUTURE) IMPLANT
TOWEL OR 17X26 10 PK STRL BLUE (TOWEL DISPOSABLE) ×1 IMPLANT
TOWEL OR NON WOVEN STRL DISP B (DISPOSABLE) ×1 IMPLANT
TRAY LAPAROSCOPIC (CUSTOM PROCEDURE TRAY) ×1 IMPLANT
TROCAR ADV FIXATION 12X100MM (TROCAR) IMPLANT
TROCAR ADV FIXATION 5X100MM (TROCAR) ×1 IMPLANT
TROCAR BALLN 12MMX100 BLUNT (TROCAR) IMPLANT
TROCAR XCEL NON-BLD 5MMX100MML (ENDOMECHANICALS) IMPLANT

## 2023-04-17 NOTE — H&P (Signed)
CC: here for surgery  Requesting provider: n/a  HPI: Evelyn Lawson is an 42 y.o. female who is here for laparoscopic cholecystectomy.  I last saw her in the clinic in December. She denies any medical changes since I saw her in the clinic  Evelyn Lawson is a 42 y.o. female who is seen today as an office consultation at the request of Dr. Laural Benes for evaluation of New Patient (Biliary colic symptom gallstones ) .   She saw her primary care doctor back in the fall with complaints of her minute upper abdominal lower chest pains that started in July. First episode occurred in July after eating a meal with green peppers. Another episode that was so severe she ended up calling EMS for she described as a burning sensation in the upper abdomen lower chest that went into her throat. She had eaten cheesecake prior to going to bed. She has had subsequent episodes as well. She took Tums and Prilosec without much improvement. She has had some vomiting as well. Underwent an abdominal ultrasound which showed cholelithiasis and a normal common bile duct.  She states that since changing her diet she has had less frequent episodes but she still has nausea and some minor discomfort in the right upper quadrant. No fever or chills. No significant NSAID use. Bowel movements are daily. No prior abdominal surgeries other than tubal ligation. Remote history of seizures as a child. Has had some acid reflux.  Is followed for thyroid nodules as well as hypertension.   Past Medical History:  Diagnosis Date   ADHD (attention deficit hyperactivity disorder)    Enlarged thyroid    ultasound in epic 08-07-2020  bilateral nodules   (08-16-2020 per pt pcp has referred her to an endocrinologist)   GERD (gastroesophageal reflux disease)    Heart murmur    Hemorrhoids    History of pneumonia    History of seizures as a child    last febrile seizure age 33   Hypertension    followed by pcp   Migraines    Numbness and tingling  of both feet    Social anxiety disorder    Thyroid nodule    Wears contact lenses     Past Surgical History:  Procedure Laterality Date   BREAST ENHANCEMENT SURGERY  11/30/2008   HEMORRHOID SURGERY  2013   LAPAROSCOPIC TUBAL LIGATION Bilateral 08/25/2020   Procedure: LAPAROSCOPIC TUBAL LIGATION WITH BIPOLAR CAUTERY;  Surgeon: Maxie Better, MD;  Location: [facility] [place];  Service: Gynecology;  Laterality: Bilateral;   WISDOM TOOTH EXTRACTION      Family History  Problem Relation Age of Onset   Cancer Maternal Grandmother        breast   Breast cancer Maternal Grandmother    Cancer Paternal Grandmother        breast   Breast cancer Paternal Grandmother    Cancer Paternal Grandfather        colon   Diabetes Maternal Grandfather    Breast cancer Maternal Aunt    Thyroid disease Neg Hx     Social:  reports that she has never smoked. She has never used smokeless tobacco. She reports current alcohol use. She reports that she does not use drugs.  Allergies:  Allergies  Allergen Reactions   Other Swelling and Rash    Peppers/HIGHLY ACIDIC FOODS   Tomato Swelling and Rash    Paste is fine     Medications: I have reviewed the patient's current medications.  ROS - all of the below systems have been reviewed with the patient and positives are indicated with bold text General: chills, fever or night sweats Eyes: blurry vision or double vision ENT: epistaxis or sore throat Allergy/Immunology: itchy/watery eyes or nasal congestion Hematologic/Lymphatic: bleeding problems, blood clots or swollen lymph nodes Endocrine: temperature intolerance or unexpected weight changes Breast: new or changing breast lumps or nipple discharge Resp: cough, shortness of breath, or wheezing CV: chest pain or dyspnea on exertion GI: as per HPI GU: dysuria, trouble voiding, or hematuria MSK: joint pain or joint stiffness Neuro: TIA or stroke symptoms Derm: pruritus and skin  lesion changes Psych: anxiety and depression  PE Blood pressure 121/82, pulse 63, temperature 98.9 F (37.2 C), temperature source Oral, resp. rate 16, height 5\' 6"  (1.676 m), weight 77.1 kg, last menstrual period 03/28/2023, SpO2 100%. Constitutional: NAD; conversant; no deformities Eyes: Moist conjunctiva; no lid lag; anicteric; PERRL Neck: Trachea midline; no thyromegaly Lungs: Normal respiratory effort; no tactile fremitus CV: RRR; no palpable thrills; no pitting edema GI: Abd soft, nt,; no palpable hepatosplenomegaly MSK: Normal gait; no clubbing/cyanosis Psychiatric: Appropriate affect; alert and oriented x3 Lymphatic: No palpable cervical or axillary lymphadenopathy Skin:no rash/lesions  Results for orders placed or performed during the hospital encounter of 04/17/23 (from the past 48 hour(s))  Pregnancy, urine POC     Status: None   Collection Time: 04/17/23  1:09 PM  Result Value Ref Range   Preg Test, Ur NEGATIVE NEGATIVE    Comment:        THE SENSITIVITY OF THIS METHODOLOGY IS >24 mIU/mL     No results found.  Imaging: reviewed  A/P: Evelyn Lawson is an 43 y.o. female with symptomatic cholelithiasis   To OR for lap chole with icg dye IV abx Eras All questions asked and answered  Mary Sella. Andrey Campanile, MD, FACS General, Bariatric, & Minimally Invasive Surgery [facility]

## 2023-04-17 NOTE — Transfer of Care (Signed)
Immediate Anesthesia Transfer of Care Note  Patient: Evelyn Lawson  Procedure(s) Performed: LAPAROSCOPIC CHOLECYSTECTOMY with ICG  Patient Location: PACU  Anesthesia Type:General  Level of Consciousness: sedated  Airway & Oxygen Therapy: Patient Spontanous Breathing and Patient connected to face mask oxygen  Post-op Assessment: Report given to RN and Post -op Vital signs reviewed and stable  Post vital signs: Reviewed and stable  Last Vitals:  Vitals Value Taken Time  BP    Temp    Pulse    Resp    SpO2      Last Pain:  Vitals:   04/17/23 1254  TempSrc: Oral  PainSc:       Patients Stated Pain Goal: 3 (04/15/23 1422)  Complications: No notable events documented.

## 2023-04-17 NOTE — Op Note (Signed)
Evelyn Lawson [id number] 1981/07/05 04/17/2023  Laparoscopic Cholecystectomy with near infrared fluorescent cholangiography procedure Note  Indications: This patient presents with symptomatic gallbladder disease and will undergo laparoscopic cholecystectomy.  Pre-operative Diagnosis: symptomatic cholelithiasis  Post-operative Diagnosis: Same  Surgeon: Gaynelle Adu MD FACS  Assistants: none  Anesthesia: General endotracheal anesthesia  Procedure Details  The patient was seen again in the Holding Room. The risks, benefits, complications, treatment options, and expected outcomes were discussed with the patient. The possibilities of reaction to medication, pulmonary aspiration, perforation of viscus, bleeding, recurrent infection, finding a normal gallbladder, the need for additional procedures, failure to diagnose a condition, the possible need to convert to an open procedure, and creating a complication requiring transfusion or operation were discussed with the patient. The likelihood of improving the patient's symptoms with return to their baseline status is good.  The patient and/or family concurred with the proposed plan, giving informed consent. The site of surgery properly noted. The patient was taken to Operating Room, identified as Evelyn Lawson and the procedure verified as Laparoscopic Cholecystectomy with ICG dye.  A Time Out was held and the above information confirmed. Antibiotic prophylaxis was administered.    ICG dye was administered preoperatively.    General endotracheal anesthesia was then administered and tolerated well. After the induction, the abdomen was prepped with Chloraprep and draped in the sterile fashion. The patient was positioned in the supine position.  Local anesthetic agent was injected into the skin near the umbilicus and an incision made.  The patient had a pre-existing small fascial defect at her umbilicus.  I made a curvilinear infraumbilical incision.  We  dissected down to the abdominal fascia with blunt dissection.  The fascia was further incised vertically and we entered the peritoneal cavity bluntly.  A pursestring suture of 0-Vicryl was placed around the fascial opening.  The Hasson cannula was inserted and secured with the stay suture.  Pneumoperitoneum was then created with CO2 and tolerated well without any adverse changes in the patient's vital signs. An 5-mm port was placed in the subxiphoid position.  Two 5-mm ports were placed in the right upper quadrant. All skin incisions were infiltrated with a local anesthetic agent before making the incision and placing the trocars.   We positioned the patient in reverse Trendelenburg, tilted slightly to the patient's left.  The gallbladder was identified, the fundus grasped and retracted cephalad. Adhesions were lysed bluntly and with the electrocautery where indicated, taking care not to injure any adjacent organs or viscus. The infundibulum was grasped and retracted laterally, exposing the peritoneum overlying the triangle of Calot. This was then divided and exposed in a blunt fashion. A critical view of the cystic duct and cystic artery was obtained.  The cystic duct was clearly identified and bluntly dissected circumferentially.  Utilizing the Stryker camera system near infrared fluorescent activity was visualized in the liver, cystic duct, common hepatic duct and common bile duct.  This served as a secondary confirmation of our anatomy.  The cystic duct was then ligated with clips and divided. The cystic artery which had been identified & dissected free was ligated with clips and divided as well.   The gallbladder was dissected from the liver bed in retrograde fashion with the electrocautery. The gallbladder was removed and placed in an Ecco sac.  The gallbladder and Ecco sac were then removed through the umbilical port site. The liver bed was irrigated and inspected. Hemostasis was achieved with the  electrocautery. Copious irrigation was  utilized and was repeatedly aspirated until clear.  The pursestring suture was used to close the umbilical fascia.  An additional interrupted 0 Vicryl was placed at the umbilical fascia using a PMI suture passer with laparoscopic guidance.  We again inspected the right upper quadrant for hemostasis.  The umbilical closure was inspected and there was no air leak and nothing trapped within the closure. Pneumoperitoneum was released as we removed the trocars.  I used a 3-0 Vicryl to tack the base of the umbilical stalk back down to the fascia.  I then closed some of the deep dermis at the umbilical incision with inverted interrupted 3-0 Vicryl sutures.  4-0 Monocryl was used to close the skin.    steri-strips, and clean dressings were applied. The patient was then extubated and brought to the recovery room in stable condition. Instrument, sponge, and needle counts were correct at closure and at the conclusion of the case.   Findings: Small pre-existing fascial defect at the umbilicus, positive critical view; near infrared fluorescent activity seen within the common hepatic duct, common bile duct, cystic duct and small bowel  Estimated Blood Loss: Minimal         Drains: none         Specimens: Gallbladder           Complications: None; patient tolerated the procedure well.         Disposition: PACU - hemodynamically stable.         Condition: stable  Mary Sella. Andrey Campanile, MD, FACS General, Bariatric, & Minimally Invasive Surgery [facility],  [facility]

## 2023-04-17 NOTE — Anesthesia Postprocedure Evaluation (Signed)
Anesthesia Post Note  Patient: Evelyn Lawson  Procedure(s) Performed: LAPAROSCOPIC CHOLECYSTECTOMY with ICG     Patient location during evaluation: PACU Anesthesia Type: General Level of consciousness: awake and alert Pain management: pain level controlled Vital Signs Assessment: post-procedure vital signs reviewed and stable Respiratory status: spontaneous breathing, nonlabored ventilation and respiratory function stable Cardiovascular status: stable and blood pressure returned to baseline Anesthetic complications: no   No notable events documented.  Last Vitals:  Vitals:   04/17/23 1645 04/17/23 1655  BP: 101/76 107/84  Pulse: 63 68  Resp: 10 16  Temp:    SpO2: 93% 98%    Last Pain:  Vitals:   04/17/23 1655  TempSrc:   PainSc: 3                  Beryle Lathe

## 2023-04-17 NOTE — Anesthesia Procedure Notes (Signed)
Procedure Name: Intubation Date/Time: 04/17/2023 2:25 PM  Performed by: Leetta Hendriks D, CRNAPre-anesthesia Checklist: Patient identified, Emergency Drugs available, Suction available and Patient being monitored Patient Re-evaluated:Patient Re-evaluated prior to induction Oxygen Delivery Method: Circle system utilized Preoxygenation: Pre-oxygenation with 100% oxygen Induction Type: IV induction Ventilation: Mask ventilation without difficulty Laryngoscope Size: Mac and 3 Grade View: Grade I Tube type: Oral Tube size: 7.0 mm Number of attempts: 1 Airway Equipment and Method: Stylet and Oral airway Placement Confirmation: ETT inserted through vocal cords under direct vision, positive ETCO2 and breath sounds checked- equal and bilateral Secured at: 21 cm Tube secured with: Tape Dental Injury: Teeth and Oropharynx as per pre-operative assessment

## 2023-04-17 NOTE — Discharge Instructions (Signed)

## 2023-04-18 ENCOUNTER — Encounter (HOSPITAL_COMMUNITY): Payer: Self-pay | Admitting: General Surgery

## 2023-06-13 ENCOUNTER — Ambulatory Visit: Payer: BC Managed Care – PPO | Attending: Internal Medicine | Admitting: Internal Medicine

## 2023-06-13 ENCOUNTER — Encounter: Payer: Self-pay | Admitting: Internal Medicine

## 2023-06-13 VITALS — BP 112/82 | HR 71 | Temp 99.0°F | Ht 66.0 in | Wt 187.1 lb

## 2023-06-13 DIAGNOSIS — F320 Major depressive disorder, single episode, mild: Secondary | ICD-10-CM | POA: Diagnosis not present

## 2023-06-13 DIAGNOSIS — I10 Essential (primary) hypertension: Secondary | ICD-10-CM | POA: Diagnosis not present

## 2023-06-13 DIAGNOSIS — G4700 Insomnia, unspecified: Secondary | ICD-10-CM | POA: Diagnosis not present

## 2023-06-13 DIAGNOSIS — Z23 Encounter for immunization: Secondary | ICD-10-CM | POA: Diagnosis not present

## 2023-06-13 DIAGNOSIS — N943 Premenstrual tension syndrome: Secondary | ICD-10-CM | POA: Diagnosis not present

## 2023-06-13 MED ORDER — PROPRANOLOL HCL ER 160 MG PO CP24
160.0000 mg | ORAL_CAPSULE | Freq: Every day | ORAL | 1 refills | Status: DC
Start: 2023-06-13 — End: 2023-12-18

## 2023-06-13 MED ORDER — TRAZODONE HCL 50 MG PO TABS: 50.0000 mg | ORAL_TABLET | Freq: Every day | ORAL | 1 refills | Status: DC

## 2023-06-13 MED ORDER — AMLODIPINE BESYLATE 10 MG PO TABS
10.0000 mg | ORAL_TABLET | Freq: Every day | ORAL | 1 refills | Status: DC
Start: 2023-06-13 — End: 2023-12-18

## 2023-06-13 MED ORDER — SERTRALINE HCL 50 MG PO TABS
50.0000 mg | ORAL_TABLET | Freq: Every day | ORAL | 2 refills | Status: DC
Start: 2023-06-13 — End: 2024-01-08

## 2023-06-13 NOTE — Progress Notes (Signed)
Patient ID: Evelyn Lawson, female    DOB: September 29, 1980  MRN: [id number]  CC: Hypertension (HTN f/u. Med refills./Requesting refill for Trazodone to aid with sleep./Flu vax administered 06/13/23 - C.A.)   Subjective: Evelyn Lawson is a 42 y.o. female who presents for chronic ds management. Her concerns today include:  Patient with history of HTN, vitamin D insufficiency, HL, MNG/right dominant nodule (benign follicular changes on bx, ADHD (Dr. Marisue Brooklyn), migraines, mass RT breast (MMG through [facility] 06/2020.  Repeat in 6 mths)    HTN:  taking Norvasc 10 mg and Propranolol 160 mg daily Checks BP but not often.  Had appt with specialist last wk and reading was good.  Tries to Limit salt.   Gained 17 lbs since July 2024. Ate more during the month off August while off work post gall bladder surgery. Was was grabbing more fast foods.   Mom cooks and pt cooks some as well.  Mom does a lot of fried foods.   She was walking more after surgery but has not been able to squeeze it in since being back at work.    Needs RF on Zoloft for PMS Request to restart Trazodone to help with sleep.  Helps her fall a sleep.  She sleeps with the TV on with a timer that goes off after a while.  Does not drink caffeinated beverages at nights. PHQ9 pos today. Mild depression - "bad mood days but not major where I can't function." Thinks it may be an adjustment because she has been out of ext release Adderall due to shortage; now on fast acting form.   Had mammogram done through [facility] mammography in June.  I did receive the report and it was normal.  However patient states that she subsequently received another letter stating that she needs to have another study done and I would need to order it.  I have not received a subsequent letter from [facility] regarding this.  I have requested that patient sends me a copy of the letter via MyChart.  Patient Active Problem List   Diagnosis Date Noted   PMS (premenstrual syndrome)  03/02/2021   Mixed hyperlipidemia 11/30/2020   Stressful life event affecting family 11/30/2020   Thyroid nodule 11/30/2020   Multinodular goiter 09/25/2020   Encounter for sterilization 08/17/2020   Essential hypertension 08/01/2020   Thyroid enlargement 08/01/2020   Vitamin D deficiency 08/01/2020   Influenza vaccination declined 08/01/2020   External hemorrhoids with irritation/pain 01/31/2014   Constipation, chronic 01/31/2014   Internal hemorrhoids with complication 01/31/2014     Current Outpatient Medications on File Prior to Visit  Medication Sig Dispense Refill   amLODipine (NORVASC) 10 MG tablet Take 1 tablet (10 mg total) by mouth daily. 90 tablet 1   amphetamine-dextroamphetamine (ADDERALL XR) 25 MG 24 hr capsule Take 50 mg by mouth every morning.     amphetamine-dextroamphetamine (ADDERALL) 20 MG tablet Take 10-20 mg by mouth daily as needed (attention/focus (afternoons)).     famotidine (PEPCID) 20 MG tablet Take 1 tablet (20 mg total) by mouth 2 (two) times daily. (Patient taking differently: Take 20 mg by mouth 2 (two) times daily as needed for heartburn or indigestion.) 30 tablet 0   Multiple Vitamin (MULTIVITAMIN WITH MINERALS) TABS tablet Take 1 tablet by mouth daily. Women's Multivitamin     omeprazole (PRILOSEC) 20 MG capsule Take 1 capsule (20 mg total) by mouth daily. (Patient taking differently: Take 20 mg by mouth daily as needed (indigestion/heartburn.).)  30 capsule 6   oxyCODONE (OXY IR/ROXICODONE) 5 MG immediate release tablet Take 1 tablet (5 mg total) by mouth every 6 (six) hours as needed for severe pain. 15 tablet 0   propranolol ER (INDERAL LA) 160 MG SR capsule Take 1 capsule (160 mg total) by mouth daily. 90 capsule 1   sertraline (ZOLOFT) 50 MG tablet Take 1 tablet (50 mg total) by mouth daily. 90 tablet 2   No current facility-administered medications on file prior to visit.    Allergies  Allergen Reactions   Other Swelling and Rash     Peppers/HIGHLY ACIDIC FOODS   Tomato Swelling and Rash    Paste is fine     Social History   Socioeconomic History   Marital status: Single    Spouse name: Not on file   Number of children: 1   Years of education: Not on file   Highest education level: Bachelor's degree (e.g., BA, AB, BS)  Occupational History   Occupation: HR specialist  Tobacco Use   Smoking status: Never   Smokeless tobacco: Never  Vaping Use   Vaping status: Never Used  Substance and Sexual Activity   Alcohol use: Yes    Comment: social   Drug use: Never   Sexual activity: Not on file  Other Topics Concern   Not on file  Social History Narrative   ** Merged History Encounter **       Social Determinants of Health   Financial Resource Strain: Low Risk  (12/23/2022)   Overall Financial Resource Strain (CARDIA)    Difficulty of Paying Living Expenses: Not very hard  Food Insecurity: No Food Insecurity (12/23/2022)   Hunger Vital Sign    Worried About Running Out of Food in the Last Year: Never true    Ran Out of Food in the Last Year: Never true  Transportation Needs: No Transportation Needs (12/23/2022)   PRAPARE - Administrator, Civil Service (Medical): No    Lack of Transportation (Non-Medical): No  Physical Activity: Unknown (12/23/2022)   Exercise Vital Sign    Days of Exercise per Week: 0 days    Minutes of Exercise per Session: Not on file  Stress: No Stress Concern Present (12/23/2022)   [employer] of Occupational Health - Occupational Stress Questionnaire    Feeling of Stress : Only a little  Social Connections: Socially Isolated (12/23/2022)   Social Connection and Isolation Panel [NHANES]    Frequency of Communication with Friends and Family: More than three times a week    Frequency of Social Gatherings with Friends and Family: More than three times a week    Attends Religious Services: Never    Database administrator or Organizations: No    Attends Hospital doctor: Not on file    Marital Status: Never married  Intimate Partner Violence: Unknown (12/24/2021)   Received from [employer]   HITS    Physically Hurt: Not on file    Insult or Talk Down To: Not on file    Threaten Physical Harm: Not on file    Scream or Curse: Not on file    Family History  Problem Relation Age of Onset   Cancer Maternal Grandmother        breast   Breast cancer Maternal Grandmother    Cancer Paternal Grandmother        breast   Breast cancer Paternal Grandmother    Cancer Paternal Grandfather  colon   Diabetes Maternal Grandfather    Breast cancer Maternal Aunt    Thyroid disease Neg Hx     Past Surgical History:  Procedure Laterality Date   BREAST ENHANCEMENT SURGERY  11/30/2008   CHOLECYSTECTOMY N/A 04/17/2023   Procedure: LAPAROSCOPIC CHOLECYSTECTOMY with ICG;  Surgeon: Gaynelle Adu, MD;  Location: [facility];  Service: General;  Laterality: N/A;   HEMORRHOID SURGERY  2013   LAPAROSCOPIC TUBAL LIGATION Bilateral 08/25/2020   Procedure: LAPAROSCOPIC TUBAL LIGATION WITH BIPOLAR CAUTERY;  Surgeon: Maxie Better, MD;  Location: [facility] [place];  Service: Gynecology;  Laterality: Bilateral;   WISDOM TOOTH EXTRACTION      ROS: Review of Systems Negative except as stated above  PHYSICAL EXAM: BP 112/82 (BP Location: Left Arm, Patient Position: Sitting, Cuff Size: Normal)   Pulse 71   Temp 99 F (37.2 C) (Oral)   Ht 5\' 6"  (1.676 m)   Wt 187 lb 1.6 oz (84.9 kg)   SpO2 100%   BMI 30.20 kg/m   Wt Readings from Last 3 Encounters:  06/13/23 187 lb 1.6 oz (84.9 kg)  04/17/23 170 lb (77.1 kg)  02/07/23 169 lb (76.7 kg)    Physical Exam  General appearance - alert, well appearing, and in no distress Mental status - normal mood, behavior, speech, dress, motor activity, and thought processes Neck -mild thyroid enlargement. Chest - clear to auscultation, no wheezes, rales or rhonchi, symmetric air entry Heart -  normal rate, regular rhythm, normal S1, S2, no murmurs, rubs, clicks or gallops Extremities - peripheral pulses normal, no pedal edema, no clubbing or cyanosis     06/13/2023   11:39 AM 02/07/2023   11:00 AM 12/23/2022    2:49 PM  Depression screen PHQ 2/9  Decreased Interest 2 1 0  Down, Depressed, Hopeless 2 1 0  PHQ - 2 Score 4 2 0  Altered sleeping 3 1 0  Tired, decreased energy 2 1 0  Change in appetite 3 1 0  Feeling bad or failure about yourself  2 1 0  Trouble concentrating 2 1 0  Moving slowly or fidgety/restless 2 1 1   Suicidal thoughts 0 0 0  PHQ-9 Score 18 8 1   Difficult doing work/chores Very difficult        06/13/2023   11:39 AM 02/07/2023   11:00 AM 12/23/2022    2:49 PM 08/05/2022   10:51 AM  GAD 7 : Generalized Anxiety Score  Nervous, Anxious, on Edge 1 1 1 1   Control/stop worrying 1 0 0 0  Worry too much - different things 0 0 0 0  Trouble relaxing 2 1 0 1  Restless 1 1 0 0  Easily annoyed or irritable 2 1 0 1  Afraid - awful might happen 2 0 0 0  Total GAD 7 Score 9 4 1 3   Anxiety Difficulty Very difficult           Latest Ref Rng & Units 04/17/2023    1:25 PM 08/05/2022   11:32 AM 08/02/2021    2:40 PM  CMP  Glucose 70 - 99 mg/dL 89  96  76   BUN 6 - 20 mg/dL 11  13  9    Creatinine 0.44 - 1.00 mg/dL 5.36  6.44  0.34   Sodium 135 - 145 mmol/L 138  138  139   Potassium 3.5 - 5.1 mmol/L 3.3  4.1  4.2   Chloride 98 - 111 mmol/L 104  99  98  CO2 22 - 32 mmol/L 25  25  26    Calcium 8.9 - 10.3 mg/dL 9.2  9.9  9.7   Total Protein 6.0 - 8.5 g/dL  7.3  7.6   Total Bilirubin 0.0 - 1.2 mg/dL  0.6  0.4   Alkaline Phos 44 - 121 IU/L  68  63   AST 0 - 40 IU/L  18  13   ALT 0 - 32 IU/L  13  8    Lipid Panel     Component Value Date/Time   CHOL 227 (H) 08/05/2022 1132   TRIG 72 08/05/2022 1132   HDL 96 08/05/2022 1132   CHOLHDL 2.4 08/05/2022 1132   LDLCALC 119 (H) 08/05/2022 1132    CBC    Component Value Date/Time   WBC 6.0 08/05/2022 1132    WBC 5.8 08/25/2020 1222   RBC 4.41 08/05/2022 1132   RBC 4.79 08/25/2020 1222   HGB 13.9 08/05/2022 1132   HCT 41.6 08/05/2022 1132   PLT 323 08/05/2022 1132   MCV 94 08/05/2022 1132   MCH 31.5 08/05/2022 1132   MCH 32.2 08/25/2020 1222   MCHC 33.4 08/05/2022 1132   MCHC 32.2 08/25/2020 1222   RDW 11.7 08/05/2022 1132   LYMPHSABS 1.9 08/02/2021 1440   EOSABS 0.2 08/02/2021 1440   BASOSABS 0.1 08/02/2021 1440    ASSESSMENT AND PLAN:  1. Essential hypertension Close to goal.  Continue Norvasc and propranolol. - amLODipine (NORVASC) 10 MG tablet; Take 1 tablet (10 mg total) by mouth daily.  Dispense: 90 tablet; Refill: 1 - propranolol ER (INDERAL LA) 160 MG SR capsule; Take 1 capsule (160 mg total) by mouth daily.  Dispense: 90 capsule; Refill: 1  2. PMS (premenstrual syndrome) Refill given on Zoloft. - sertraline (ZOLOFT) 50 MG tablet; Take 1 tablet (50 mg total) by mouth daily.  Dispense: 90 tablet; Refill: 2  3. Insomnia, unspecified type Discussed and encouraged good sleep hygiene.  Encourage patient to turn off the TV when she gets in bed.  Try not to lie in bed scrolling through her phone.  Restart trazodone 50 mg at bedtime.  Went over signs and/symptoms of serotonin syndrome which can sometimes occur in combination with Zoloft.  Advised to stop the medicine if she develops any of the symptoms. - traZODone (DESYREL) 50 MG tablet; Take 1 tablet (50 mg total) by mouth at bedtime.  Dispense: 90 tablet; Refill: 1  4. Major depressive disorder, single episode, mild (HCC) Patient feels this is not a major issue at this time and does not feel that she needs counseling.  Currently on Zoloft.  5. Encounter for immunization - Flu vaccine trivalent PF, 6mos and older(Flulaval,Afluria,Fluarix,Fluzone)    Patient was given the opportunity to ask questions.  Patient verbalized understanding of the plan and was able to repeat key elements of the plan.   This documentation was completed  using Paediatric nurse.  Any transcriptional errors are unintentional.  Orders Placed This Encounter  Procedures   Flu vaccine trivalent PF, 6mos and older(Flulaval,Afluria,Fluarix,Fluzone)     Requested Prescriptions   Pending Prescriptions Disp Refills   amLODipine (NORVASC) 10 MG tablet 90 tablet 1    Sig: Take 1 tablet (10 mg total) by mouth daily.   propranolol ER (INDERAL LA) 160 MG SR capsule 90 capsule 1    Sig: Take 1 capsule (160 mg total) by mouth daily.   sertraline (ZOLOFT) 50 MG tablet 90 tablet 2    Sig: Take  1 tablet (50 mg total) by mouth daily.    No follow-ups on file.  Jonah Blue, MD, FACP

## 2023-10-01 ENCOUNTER — Other Ambulatory Visit: Payer: Self-pay | Admitting: Medical Genetics

## 2023-10-17 ENCOUNTER — Ambulatory Visit: Payer: BC Managed Care – PPO | Attending: Internal Medicine | Admitting: Internal Medicine

## 2023-10-17 VITALS — BP 121/80 | HR 70 | Temp 98.2°F | Ht 66.0 in | Wt 177.0 lb

## 2023-10-17 DIAGNOSIS — I10 Essential (primary) hypertension: Secondary | ICD-10-CM | POA: Diagnosis not present

## 2023-10-17 DIAGNOSIS — N943 Premenstrual tension syndrome: Secondary | ICD-10-CM

## 2023-10-17 DIAGNOSIS — Z6828 Body mass index (BMI) 28.0-28.9, adult: Secondary | ICD-10-CM

## 2023-10-17 DIAGNOSIS — K219 Gastro-esophageal reflux disease without esophagitis: Secondary | ICD-10-CM

## 2023-10-17 DIAGNOSIS — Z1331 Encounter for screening for depression: Secondary | ICD-10-CM

## 2023-10-17 DIAGNOSIS — F22 Delusional disorders: Secondary | ICD-10-CM | POA: Diagnosis not present

## 2023-10-17 DIAGNOSIS — E663 Overweight: Secondary | ICD-10-CM

## 2023-10-17 MED ORDER — OMEPRAZOLE 20 MG PO CPDR: 20.0000 mg | DELAYED_RELEASE_CAPSULE | Freq: Every day | ORAL | 3 refills | Status: DC | PRN

## 2023-10-17 NOTE — Progress Notes (Unsigned)
Patient ID: Evelyn Lawson, female    DOB: May 25, 1981  MRN: [id number]  CC: Hypertension (HTN f/u. /No questions / concerns/Yes to pneumonia vax)   Subjective: Evelyn Lawson is a 43 y.o. female who presents for chronic ds management. Her concerns today include:  Patient with history of HTN, vitamin D insufficiency, HL, MNG/right dominant nodule (benign follicular changes on bx, ADHD (Dr. Marisue Brooklyn), migraines, mass RT breast (MMG through [facility] 06/2020.  Repeat in 6 mths)   Discussed the use of AI scribe software for clinical note transcription with the patient, who gave verbal consent to proceed.  History of Present Illness   Pt presents with recent auditory hallucinations. She describes hearing conversations about herself that others deny having. These incidents have occurred several times, involving different people in various settings, including her sister and her boyfriend at home, and neighbors in her apartment complex. The patient is unsure if these are real conversations or if her brain is "playing tricks" on her. She wonders whether her medications, Zoloft and Trazodone, may be contributing to these experiences.  She has been on Zoloft for several years for PMS and has done well with it.  Trazodone was started on last visit for insomnia. -The patient also reports a recent move to an apartment from her mother's house due to these perceived conversations and other unspecified issues. Since moving, she has been isolating more, which she acknowledges may not be healthy, but she feels more relaxed. She denies current issues with depression or anxiety despite having elev PHQ9 and GAD7 scores today.  The patient has been trying to lose weight and has lost 10 pounds since the last visit. She attributes this to changes in diet since moving out of her mother's house.  She does not exercise but moved to an apartment building 2 months ago where she is having to climb 3 flights of stairs to get to her  apartment.  She continues to take her blood pressure medication, Amlodipine, and her blood pressure is well controlled.      Patient Active Problem List   Diagnosis Date Noted   PMS (premenstrual syndrome) 03/02/2021   Mixed hyperlipidemia 11/30/2020   Stressful life event affecting family 11/30/2020   Thyroid nodule 11/30/2020   Multinodular goiter 09/25/2020   Encounter for sterilization 08/17/2020   Essential hypertension 08/01/2020   Thyroid enlargement 08/01/2020   Vitamin D deficiency 08/01/2020   Influenza vaccination declined 08/01/2020   External hemorrhoids with irritation/pain 01/31/2014   Constipation, chronic 01/31/2014   Internal hemorrhoids with complication 01/31/2014     Current Outpatient Medications on File Prior to Visit  Medication Sig Dispense Refill   albuterol (VENTOLIN HFA) 108 (90 Base) MCG/ACT inhaler Inhale 2 puffs into the lungs every 6 (six) hours as needed for wheezing or shortness of breath.     amLODipine (NORVASC) 10 MG tablet Take 1 tablet (10 mg total) by mouth daily. 90 tablet 1   amphetamine-dextroamphetamine (ADDERALL XR) 25 MG 24 hr capsule Take 50 mg by mouth every morning.     amphetamine-dextroamphetamine (ADDERALL) 20 MG tablet Take 10-20 mg by mouth daily as needed (attention/focus (afternoons)).     cetirizine (ZYRTEC) 10 MG tablet Take 10 mg by mouth daily.     famotidine (PEPCID) 20 MG tablet Take 1 tablet (20 mg total) by mouth 2 (two) times daily. (Patient taking differently: Take 20 mg by mouth 2 (two) times daily as needed for heartburn or indigestion.) 30 tablet 0  Multiple Vitamin (MULTIVITAMIN WITH MINERALS) TABS tablet Take 1 tablet by mouth daily. Women's Multivitamin     Multiple Vitamin (MULTIVITAMIN) capsule Take 1 capsule by mouth daily.     oxyCODONE (OXY IR/ROXICODONE) 5 MG immediate release tablet Take 1 tablet (5 mg total) by mouth every 6 (six) hours as needed for severe pain. 15 tablet 0   propranolol ER (INDERAL LA)  160 MG SR capsule Take 1 capsule (160 mg total) by mouth daily. 90 capsule 1   Saccharomyces boulardii (PROBIOTIC) 250 MG CAPS Take 250 mg by mouth.     sertraline (ZOLOFT) 50 MG tablet Take 1 tablet (50 mg total) by mouth daily. 90 tablet 2   traZODone (DESYREL) 50 MG tablet Take 1 tablet (50 mg total) by mouth at bedtime. 90 tablet 1   No current facility-administered medications on file prior to visit.    Allergies  Allergen Reactions   Other Swelling and Rash    Peppers/HIGHLY ACIDIC FOODS   Tomato Swelling and Rash    Paste is fine     Social History   Socioeconomic History   Marital status: Single    Spouse name: Not on file   Number of children: 1   Years of education: Not on file   Highest education level: Bachelor's degree (e.g., BA, AB, BS)  Occupational History   Occupation: HR specialist  Tobacco Use   Smoking status: Never   Smokeless tobacco: Never  Vaping Use   Vaping status: Never Used  Substance and Sexual Activity   Alcohol use: Yes    Comment: social   Drug use: Never   Sexual activity: Not on file  Other Topics Concern   Not on file  Social History Narrative   ** Merged History Encounter **       Social Drivers of Health   Financial Resource Strain: Low Risk  (10/17/2023)   Overall Financial Resource Strain (CARDIA)    Difficulty of Paying Living Expenses: Not very hard  Food Insecurity: No Food Insecurity (10/17/2023)   Hunger Vital Sign    Worried About Running Out of Food in the Last Year: Never true    Ran Out of Food in the Last Year: Never true  Transportation Needs: No Transportation Needs (10/17/2023)   PRAPARE - Administrator, Civil Service (Medical): No    Lack of Transportation (Non-Medical): No  Physical Activity: Inactive (10/17/2023)   Exercise Vital Sign    Days of Exercise per Week: 0 days    Minutes of Exercise per Session: 0 min  Stress: Stress Concern Present (10/17/2023)   [employer] of Occupational  Health - Occupational Stress Questionnaire    Feeling of Stress : To some extent  Social Connections: Moderately Isolated (10/17/2023)   Social Connection and Isolation Panel [NHANES]    Frequency of Communication with Friends and Family: More than three times a week    Frequency of Social Gatherings with Friends and Family: Once a week    Attends Religious Services: Never    Database administrator or Organizations: Yes    Attends Banker Meetings: Never    Marital Status: Never married  Intimate Partner Violence: Not At Risk (10/17/2023)   Humiliation, Afraid, Rape, and Kick questionnaire    Fear of Current or Ex-Partner: No    Emotionally Abused: No    Physically Abused: No    Sexually Abused: No    Family History  Problem Relation Age of Onset  Cancer Maternal Grandmother        breast   Breast cancer Maternal Grandmother    Cancer Paternal Grandmother        breast   Breast cancer Paternal Grandmother    Cancer Paternal Grandfather        colon   Diabetes Maternal Grandfather    Breast cancer Maternal Aunt    Thyroid disease Neg Hx     Past Surgical History:  Procedure Laterality Date   BREAST ENHANCEMENT SURGERY  11/30/2008   CHOLECYSTECTOMY N/A 04/17/2023   Procedure: LAPAROSCOPIC CHOLECYSTECTOMY with ICG;  Surgeon: Gaynelle Adu, MD;  Location: [facility];  Service: General;  Laterality: N/A;   HEMORRHOID SURGERY  2013   LAPAROSCOPIC TUBAL LIGATION Bilateral 08/25/2020   Procedure: LAPAROSCOPIC TUBAL LIGATION WITH BIPOLAR CAUTERY;  Surgeon: Maxie Better, MD;  Location: [facility];  Service: Gynecology;  Laterality: Bilateral;   WISDOM TOOTH EXTRACTION      ROS: Review of Systems Negative except as stated above  PHYSICAL EXAM: BP 121/80 (BP Location: Left Arm, Patient Position: Sitting, Cuff Size: Normal)   Pulse 70   Temp 98.2 F (36.8 C) (Oral)   Ht 5\' 6"  (1.676 m)   Wt 177 lb (80.3 kg)   SpO2 100%   BMI 28.57 kg/m   Wt  Readings from Last 3 Encounters:  10/17/23 177 lb (80.3 kg)  06/13/23 187 lb 1.6 oz (84.9 kg)  04/17/23 170 lb (77.1 kg)    Physical Exam  General appearance - alert, well appearing, midle age AAF and in no distress Mental status - normal mood, behavior, speech, dress, motor activity, and thought processes Chest - clear to auscultation, no wheezes, rales or rhonchi, symmetric air entry Heart - normal rate, regular rhythm, normal S1, S2, no murmurs, rubs, clicks or gallops Extremities - peripheral pulses normal, no pedal edema, no clubbing or cyanosis     10/17/2023   11:58 AM 06/13/2023   11:39 AM 02/07/2023   11:00 AM  Depression screen PHQ 2/9  Decreased Interest 1 2 1   Down, Depressed, Hopeless 1 2 1   PHQ - 2 Score 2 4 2   Altered sleeping 2 3 1   Tired, decreased energy 2 2 1   Change in appetite 2 3 1   Feeling bad or failure about yourself  2 2 1   Trouble concentrating 2 2 1   Moving slowly or fidgety/restless 2 2 1   Suicidal thoughts 0 0 0  PHQ-9 Score 14 18 8   Difficult doing work/chores Somewhat difficult Very difficult       10/17/2023   11:59 AM 06/13/2023   11:39 AM 02/07/2023   11:00 AM 12/23/2022    2:49 PM  GAD 7 : Generalized Anxiety Score  Nervous, Anxious, on Edge 2 1 1 1   Control/stop worrying 1 1 0 0  Worry too much - different things 2 0 0 0  Trouble relaxing 2 2 1  0  Restless 1 1 1  0  Easily annoyed or irritable 2 2 1  0  Afraid - awful might happen 1 2 0 0  Total GAD 7 Score 11 9 4 1   Anxiety Difficulty Somewhat difficult Very difficult          Latest Ref Rng & Units 04/17/2023    1:25 PM 08/05/2022   11:32 AM 08/02/2021    2:40 PM  CMP  Glucose 70 - 99 mg/dL 89  96  76   BUN 6 - 20 mg/dL 11  13  9  Creatinine 0.44 - 1.00 mg/dL 1.61  0.96  0.45   Sodium 135 - 145 mmol/L 138  138  139   Potassium 3.5 - 5.1 mmol/L 3.3  4.1  4.2   Chloride 98 - 111 mmol/L 104  99  98   CO2 22 - 32 mmol/L 25  25  26    Calcium 8.9 - 10.3 mg/dL 9.2  9.9  9.7    Total Protein 6.0 - 8.5 g/dL  7.3  7.6   Total Bilirubin 0.0 - 1.2 mg/dL  0.6  0.4   Alkaline Phos 44 - 121 IU/L  68  63   AST 0 - 40 IU/L  18  13   ALT 0 - 32 IU/L  13  8    Lipid Panel     Component Value Date/Time   CHOL 227 (H) 08/05/2022 1132   TRIG 72 08/05/2022 1132   HDL 96 08/05/2022 1132   CHOLHDL 2.4 08/05/2022 1132   LDLCALC 119 (H) 08/05/2022 1132    CBC    Component Value Date/Time   WBC 6.0 08/05/2022 1132   WBC 5.8 08/25/2020 1222   RBC 4.41 08/05/2022 1132   RBC 4.79 08/25/2020 1222   HGB 13.9 08/05/2022 1132   HCT 41.6 08/05/2022 1132   PLT 323 08/05/2022 1132   MCV 94 08/05/2022 1132   MCH 31.5 08/05/2022 1132   MCH 32.2 08/25/2020 1222   MCHC 33.4 08/05/2022 1132   MCHC 32.2 08/25/2020 1222   RDW 11.7 08/05/2022 1132   LYMPHSABS 1.9 08/02/2021 1440   EOSABS 0.2 08/02/2021 1440   BASOSABS 0.1 08/02/2021 1440    ASSESSMENT AND PLAN: 1. Essential hypertension At goal.  Continue Norvasc 10 mg daily.  2. Paranoid reaction (HCC) Paranoid versus auditory hallucinations.  Patient has been on Zoloft for years without any issues.  Question whether trazodone may be playing a role. She is agreeable to behavioral health referral. - Ambulatory referral to Psychiatry  3. PMS (premenstrual syndrome) Continue Zoloft - Ambulatory referral to Psychiatry  4. Gastroesophageal reflux disease without esophagitis Patient requested refill on omeprazole - omeprazole (PRILOSEC) 20 MG capsule; Take 1 capsule (20 mg total) by mouth daily as needed (indigestion/heartburn.).  Dispense: 30 capsule; Refill: 3  5. Positive depression screening (Primary) Patient denies any issues with depression at this time depression screen is positive.  We are referring her to behavioral health.  6. Overweight (BMI 25.0-29.9) Commended her on weight loss so far.  Encouraged her to continue trying to eat healthy and to move as much as she can.    Patient was given the opportunity to  ask questions.  Patient verbalized understanding of the plan and was able to repeat key elements of the plan.   This documentation was completed using Paediatric nurse.  Any transcriptional errors are unintentional.  Orders Placed This Encounter  Procedures   Ambulatory referral to Psychiatry     Requested Prescriptions   Signed Prescriptions Disp Refills   omeprazole (PRILOSEC) 20 MG capsule 30 capsule 3    Sig: Take 1 capsule (20 mg total) by mouth daily as needed (indigestion/heartburn.).    Return in about 6 months (around 04/15/2024).  Jonah Blue, MD, FACP

## 2023-10-31 ENCOUNTER — Other Ambulatory Visit (HOSPITAL_COMMUNITY): Payer: Self-pay | Attending: Medical Genetics

## 2023-12-18 ENCOUNTER — Other Ambulatory Visit: Payer: Self-pay | Admitting: Internal Medicine

## 2023-12-18 DIAGNOSIS — I10 Essential (primary) hypertension: Secondary | ICD-10-CM

## 2024-01-05 ENCOUNTER — Encounter: Payer: Self-pay | Admitting: Internal Medicine

## 2024-01-08 ENCOUNTER — Ambulatory Visit
Admission: RE | Admit: 2024-01-08 | Discharge: 2024-01-08 | Disposition: A | Discharge status: Home / Self Care | Source: Ambulatory Visit | Attending: Internal Medicine | Admitting: Internal Medicine

## 2024-01-08 ENCOUNTER — Ambulatory Visit: Attending: Internal Medicine | Admitting: Internal Medicine

## 2024-01-08 ENCOUNTER — Encounter: Payer: Self-pay | Admitting: Internal Medicine

## 2024-01-08 VITALS — BP 127/90 | HR 72 | Temp 98.2°F | Ht 66.0 in | Wt 179.0 lb

## 2024-01-08 DIAGNOSIS — N943 Premenstrual tension syndrome: Secondary | ICD-10-CM

## 2024-01-08 DIAGNOSIS — R55 Syncope and collapse: Secondary | ICD-10-CM | POA: Diagnosis not present

## 2024-01-08 DIAGNOSIS — G44319 Acute post-traumatic headache, not intractable: Secondary | ICD-10-CM

## 2024-01-08 MED ORDER — SERTRALINE HCL 50 MG PO TABS: 50.0000 mg | ORAL_TABLET | Freq: Every day | ORAL | 1 refills | Status: DC

## 2024-01-08 NOTE — Progress Notes (Signed)
 Patient ID: Evelyn Lawson, female    DOB: 06/02/1981  MRN: [id number]  CC: Hypertension (HTN f/u. Nicki Reaper fall on bathtub 01/03/24 - hit head & fainted, sweating episodes, intermittent headache, back pain /Yes to pneumonia vax)   Subjective: Evelyn Lawson is a 43 y.o. female who presents for UC visit Her concerns today include:  Patient with history of HTN, vitamin D insufficiency, HL, MNG/right dominant nodule (benign follicular changes on bx, ADHD (Dr. Marisue Brooklyn), migraines, mass RT breast (MMG through [facility] 06/2020.  Repeat in 6 mths)   Discussed the use of AI scribe software for clinical note transcription with the patient, who gave verbal consent to proceed.  History of Present Illness   The patient, with a past medical history of pneumonia and irregular menstrual cycles, presents with a chief complaint of a fainting episode that occurred 6 nights ago. This occurred in the context of an upper respiratory infection, which had been ongoing for two days prior to the fainting episode.  Does not think she was dehydrated as she was making a point to drink lots of water and juice during the day and had eaten.  She had done a telehealth visit earlier that evening and was prescribed medications that she did not get to pick up so she took some Delsym instead.  Woke up in the middle of the night to use the bathroom and was standing between the sink and the bathtub. She reports that the fainting episode was preceded by a feeling of everything going black and an attempt to reach for something.  When she woke up, she found that she had fallen backwards with half of her upper body in the bathtub and the shower curtain with the rack was pulled down.  She was able to get out of the tub and dragged herself on her bottom back to her bed.  She texted her friend, mother and sister to inform of the episode.  The patient denies any symptoms of heart racing or coughing spell prior to the fainting episode. The patient  also denies any injury to the lip or tongue, or any incontinence of bowel or bladder during the fainting episode. The patient reports a history of similar fainting episodes, one due to heat exhaustion in July 2021 and another around 2016 in setting of having acute illness.   For several days after the fainting episode, she was hearing a swishing sound in her head or ears when she would turn her head.  She also noted sensitivity to noise and a hot feeling in her ears.  She reports having pain in the right ear that started around the time of the upper respiratory infection.   She endorses pain at the back of the head that has gotten better.  She endorses frontal headaches with some blurred vision.  She had nausea the first day after the fall but nothing since then.   Requests refill on Zoloft which she takes for PMS. Patient Active Problem List   Diagnosis Date Noted   PMS (premenstrual syndrome) 03/02/2021   Mixed hyperlipidemia 11/30/2020   Stressful life event affecting family 11/30/2020   Thyroid nodule 11/30/2020   Multinodular goiter 09/25/2020   Encounter for sterilization 08/17/2020   Essential hypertension 08/01/2020   Thyroid enlargement 08/01/2020   Vitamin D deficiency 08/01/2020   Influenza vaccination declined 08/01/2020   External hemorrhoids with irritation/pain 01/31/2014   Constipation, chronic 01/31/2014   Internal hemorrhoids with complication 01/31/2014     Current  Outpatient Medications on File Prior to Visit  Medication Sig Dispense Refill   albuterol (VENTOLIN HFA) 108 (90 Base) MCG/ACT inhaler Inhale 2 puffs into the lungs every 6 (six) hours as needed for wheezing or shortness of breath.     amLODipine (NORVASC) 10 MG tablet TAKE 1 TABLET BY MOUTH EVERY DAY 90 tablet 1   amphetamine-dextroamphetamine (ADDERALL XR) 25 MG 24 hr capsule Take 50 mg by mouth every morning.     amphetamine-dextroamphetamine (ADDERALL) 20 MG tablet Take 10-20 mg by mouth daily as needed  (attention/focus (afternoons)).     cetirizine (ZYRTEC) 10 MG tablet Take 10 mg by mouth daily.     famotidine (PEPCID) 20 MG tablet Take 1 tablet (20 mg total) by mouth 2 (two) times daily. (Patient taking differently: Take 20 mg by mouth 2 (two) times daily as needed for heartburn or indigestion.) 30 tablet 0   Multiple Vitamin (MULTIVITAMIN WITH MINERALS) TABS tablet Take 1 tablet by mouth daily. Women's Multivitamin     Multiple Vitamin (MULTIVITAMIN) capsule Take 1 capsule by mouth daily.     omeprazole (PRILOSEC) 20 MG capsule Take 1 capsule (20 mg total) by mouth daily as needed (indigestion/heartburn.). 30 capsule 3   oxyCODONE (OXY IR/ROXICODONE) 5 MG immediate release tablet Take 1 tablet (5 mg total) by mouth every 6 (six) hours as needed for severe pain. 15 tablet 0   propranolol ER (INDERAL LA) 160 MG SR capsule TAKE 1 CAPSULE BY MOUTH DAILY. 90 capsule 1   Saccharomyces boulardii (PROBIOTIC) 250 MG CAPS Take 250 mg by mouth.     traZODone (DESYREL) 50 MG tablet Take 1 tablet (50 mg total) by mouth at bedtime. 90 tablet 1   No current facility-administered medications on file prior to visit.    Allergies  Allergen Reactions   Other Swelling and Rash    Peppers/HIGHLY ACIDIC FOODS   Tomato Swelling and Rash    Paste is fine     Social History   Socioeconomic History   Marital status: Single    Spouse name: Not on file   Number of children: 1   Years of education: Not on file   Highest education level: Bachelor's degree (e.g., BA, AB, BS)  Occupational History   Occupation: HR specialist  Tobacco Use   Smoking status: Never   Smokeless tobacco: Never  Vaping Use   Vaping status: Never Used  Substance and Sexual Activity   Alcohol use: Yes    Comment: social   Drug use: Never   Sexual activity: Not on file  Other Topics Concern   Not on file  Social History Narrative   ** Merged History Encounter **       Social Drivers of Health   Financial Resource  Strain: Low Risk  (10/17/2023)   Overall Financial Resource Strain (CARDIA)    Difficulty of Paying Living Expenses: Not very hard  Food Insecurity: No Food Insecurity (10/17/2023)   Hunger Vital Sign    Worried About Running Out of Food in the Last Year: Never true    Ran Out of Food in the Last Year: Never true  Transportation Needs: No Transportation Needs (10/17/2023)   PRAPARE - Administrator, Civil Service (Medical): No    Lack of Transportation (Non-Medical): No  Physical Activity: Inactive (10/17/2023)   Exercise Vital Sign    Days of Exercise per Week: 0 days    Minutes of Exercise per Session: 0 min  Stress: Stress Concern Present (  10/17/2023)   [employer] [employer] - Occupational Stress Questionnaire    Feeling of Stress : To some extent  Social Connections: Moderately Isolated (10/17/2023)   Social Connection and Isolation Panel [NHANES]    Frequency of Communication with Friends and Family: More than three times a week    Frequency of Social Gatherings with Friends and Family: Once a week    Attends Religious Services: Never    Database administrator or Organizations: Yes    Attends Banker Meetings: Never    Marital Status: Never married  Intimate Partner Violence: Not At Risk (10/17/2023)   Humiliation, Afraid, Rape, and Kick questionnaire    Fear of Current or Ex-Partner: No    Emotionally Abused: No    Physically Abused: No    Sexually Abused: No    Family History  Problem Relation Age of Onset   Cancer Maternal Grandmother        breast   Breast cancer Maternal Grandmother    Cancer Paternal Grandmother        breast   Breast cancer Paternal Grandmother    Cancer Paternal Grandfather        colon   Diabetes Maternal Grandfather    Breast cancer Maternal Aunt    Thyroid disease Neg Hx     Past Surgical History:  Procedure Laterality Date   BREAST ENHANCEMENT SURGERY  11/30/2008   CHOLECYSTECTOMY N/A 04/17/2023    Procedure: LAPAROSCOPIC CHOLECYSTECTOMY with ICG;  Surgeon: Gaynelle Adu, MD;  Location: [facility];  Service: General;  Laterality: N/A;   HEMORRHOID SURGERY  2013   LAPAROSCOPIC TUBAL LIGATION Bilateral 08/25/2020   Procedure: LAPAROSCOPIC TUBAL LIGATION WITH BIPOLAR CAUTERY;  Surgeon: Maxie Better, MD;  Location: [facility];  Service: Gynecology;  Laterality: Bilateral;   WISDOM TOOTH EXTRACTION      ROS: Review of Systems Negative except as stated above  PHYSICAL EXAM: BP (!) 127/90 (BP Location: Left Arm, Patient Position: Standing, Cuff Size: Normal)   Pulse 72   Temp 98.2 F (36.8 C) (Oral)   Ht 5\' 6"  (1.676 m)   Wt 179 lb (81.2 kg)   SpO2 99%   BMI 28.89 kg/m   Wt Readings from Last 3 Encounters:  01/08/24 179 lb (81.2 kg)  10/17/23 177 lb (80.3 kg)  06/13/23 187 lb 1.6 oz (84.9 kg)   Sitting: BP 125/83, P71 Standing: BP 127/90, P72  Physical Exam General appearance - alert, well appearing, and in no distress Mental status - normal mood, behavior, speech, dress, motor activity, and thought processes Ears - bilateral TM's and external ear canals normal Mouth -oral mucosa is moist. Neck - supple, no significant adenopathy.  Mild enlargement of the thyroid gland. Chest - clear to auscultation, no wheezes, rales or rhonchi, symmetric air entry Heart - normal rate, regular rhythm, normal S1, S2, no murmurs, rubs, clicks or gallops Neurological -on exam she altered sensation on the right side of the face.  Otherwise cranial nerves are intact.  On exam she has decreased sensation to gross touch in the right upper and lower arm.  Otherwise sensation intact.  Power is 5/5 in all 4 extremities and gait is stable.      Latest Ref Rng & Units 04/17/2023    1:25 PM 08/05/2022   11:32 AM 08/02/2021    2:40 PM  CMP  Glucose 70 - 99 mg/dL 89  96  76   BUN 6 - 20 mg/dL 11  13  9   Creatinine 0.44 - 1.00 mg/dL 1.61  0.96  0.45   Sodium 135 - 145 mmol/L 138   138  139   Potassium 3.5 - 5.1 mmol/L 3.3  4.1  4.2   Chloride 98 - 111 mmol/L 104  99  98   CO2 22 - 32 mmol/L 25  25  26    Calcium 8.9 - 10.3 mg/dL 9.2  9.9  9.7   Total Protein 6.0 - 8.5 g/dL  7.3  7.6   Total Bilirubin 0.0 - 1.2 mg/dL  0.6  0.4   Alkaline Phos 44 - 121 IU/L  68  63   AST 0 - 40 IU/L  18  13   ALT 0 - 32 IU/L  13  8    Lipid Panel     Component Value Date/Time   CHOL 227 (H) 08/05/2022 1132   TRIG 72 08/05/2022 1132   HDL 96 08/05/2022 1132   CHOLHDL 2.4 08/05/2022 1132   LDLCALC 119 (H) 08/05/2022 1132    CBC    Component Value Date/Time   WBC 6.0 08/05/2022 1132   WBC 5.8 08/25/2020 1222   RBC 4.41 08/05/2022 1132   RBC 4.79 08/25/2020 1222   HGB 13.9 08/05/2022 1132   HCT 41.6 08/05/2022 1132   PLT 323 08/05/2022 1132   MCV 94 08/05/2022 1132   MCH 31.5 08/05/2022 1132   MCH 32.2 08/25/2020 1222   MCHC 33.4 08/05/2022 1132   MCHC 32.2 08/25/2020 1222   RDW 11.7 08/05/2022 1132   LYMPHSABS 1.9 08/02/2021 1440   EOSABS 0.2 08/02/2021 1440   BASOSABS 0.1 08/02/2021 1440   EKG: Normal sinus rhythm.  Appears unchanged from previous EKG done last year.  ASSESSMENT AND PLAN: 1. Syncope, unspecified syncope type (Primary) Acute syncope in setting of upper respiratory infection. Differential includes vasovagal syncope, sz, or arrhythmia. Previous episodes linked to dehydration and respiratory illness.  Seizure episode sounds less likely.  May have been vasovagal but patient reports that she was keeping herself hydrated during the respiratory illness.  Orthostatic blood pressure/pulse check revealed no orthostatic changes.  Since this is the third episode in a span of 9 years, we will have her evaluated by cardiology for any arrhythmias but I doubt this is the case. - Order EKG. - Order CBC, kidney function, and liver function tests. - Advise hydration. - CBC - Comprehensive metabolic panel with GFR - EKG [id number] - Ambulatory referral to  Cardiology  2. Acute post-traumatic headache, not intractable Advised that she can continue to use Tylenol as needed for the headache.  Given she has some mild third sensation on the right side of the face and right upper arm, we will proceed with CAT scan of the head. - CT HEAD WO CONTRAST ( )  3. PMS (premenstrual syndrome) - sertraline (ZOLOFT) 50 MG tablet; Take 1 tablet (50 mg total) by mouth daily.  Dispense: 90 tablet; Refill: 1     Patient was given the opportunity to ask questions.  Patient verbalized understanding of the plan and was able to repeat key elements of the plan.   This documentation was completed using Paediatric nurse.  Any transcriptional errors are unintentional.  Orders Placed This Encounter  Procedures   CT HEAD WO CONTRAST ( )   CBC   Comprehensive metabolic panel with GFR   Ambulatory referral to Cardiology   EKG 12-Lead     Requested Prescriptions   Signed Prescriptions Disp Refills  sertraline (ZOLOFT) 50 MG tablet 90 tablet 1    Sig: Take 1 tablet (50 mg total) by mouth daily.    No follow-ups on file.  Concetta Dee, MD, FACP

## 2024-01-08 NOTE — Patient Instructions (Signed)
 Continue to stay hydrated. You can continue to use the Tylenol as needed for the headache. We have ordered a CAT scan of the head which will be done today. We have referred you to the cardiologist to make sure that you do not have any intermittent abnormal heart rhythms causing fainting episodes.

## 2024-01-09 ENCOUNTER — Encounter: Payer: Self-pay | Admitting: Internal Medicine

## 2024-01-09 LAB — COMPREHENSIVE METABOLIC PANEL WITH GFR
ALT: 10 IU/L (ref 0–32)
AST: 17 IU/L (ref 0–40)
Albumin: 4.7 g/dL (ref 3.9–4.9)
Alkaline Phosphatase: 78 IU/L (ref 44–121)
BUN/Creatinine Ratio: 10 (ref 9–23)
BUN: 9 mg/dL (ref 6–24)
Bilirubin Total: 0.3 mg/dL (ref 0.0–1.2)
CO2: 26 mmol/L (ref 20–29)
Calcium: 9.9 mg/dL (ref 8.7–10.2)
Chloride: 99 mmol/L (ref 96–106)
Creatinine, Ser: 0.86 mg/dL (ref 0.57–1.00)
Globulin, Total: 2.7 g/dL (ref 1.5–4.5)
Glucose: 83 mg/dL (ref 70–99)
Potassium: 4.1 mmol/L (ref 3.5–5.2)
Sodium: 140 mmol/L (ref 134–144)
Total Protein: 7.4 g/dL (ref 6.0–8.5)
eGFR: 86 mL/min/{1.73_m2} (ref >59)

## 2024-01-09 LAB — CBC
Hematocrit: 40.9 % (ref 34.0–46.6)
Hemoglobin: 13.3 g/dL (ref 11.1–15.9)
MCH: 31.2 pg (ref 26.6–33.0)
MCHC: 32.5 g/dL (ref 31.5–35.7)
MCV: 96 fL (ref 79–97)
Platelets: 358 10*3/uL (ref 150–450)
RBC: 4.26 x10E6/uL (ref 3.77–5.28)
RDW: 11.9 % (ref 11.7–15.4)
WBC: 6.6 10*3/uL (ref 3.4–10.8)

## 2024-01-09 NOTE — Telephone Encounter (Signed)
 Will forward to provider

## 2024-02-17 ENCOUNTER — Ambulatory Visit: Admitting: Cardiology

## 2024-04-15 ENCOUNTER — Telehealth: Payer: Self-pay | Admitting: Internal Medicine

## 2024-04-15 NOTE — Telephone Encounter (Signed)
 Called pt to confirm appt for 7/25 LVM

## 2024-04-16 ENCOUNTER — Ambulatory Visit: Payer: BC Managed Care – PPO | Attending: Internal Medicine | Admitting: Internal Medicine

## 2024-04-16 VITALS — BP 122/81 | HR 65 | Ht 66.0 in | Wt 175.0 lb

## 2024-04-16 DIAGNOSIS — Z23 Encounter for immunization: Secondary | ICD-10-CM | POA: Diagnosis not present

## 2024-04-16 DIAGNOSIS — E663 Overweight: Secondary | ICD-10-CM

## 2024-04-16 DIAGNOSIS — Z6828 Body mass index (BMI) 28.0-28.9, adult: Secondary | ICD-10-CM | POA: Diagnosis not present

## 2024-04-16 DIAGNOSIS — I10 Essential (primary) hypertension: Secondary | ICD-10-CM | POA: Diagnosis not present

## 2024-04-16 DIAGNOSIS — Z0000 Encounter for general adult medical examination without abnormal findings: Secondary | ICD-10-CM | POA: Diagnosis not present

## 2024-04-16 NOTE — Progress Notes (Signed)
 Patient ID: Evelyn Lawson, female    DOB: 21-May-1981  MRN: [id number]  CC: Annual Exam (Physical./No questions / concerns/Pneumonia & Hep B vax administered on 04/16/2024)   Subjective: Evelyn Lawson is a 43 y.o. female who presents for physical. Her concerns today include:  Patient with history of HTN, vitamin D  insufficiency, HL, MNG/right dominant nodule (benign follicular changes on bx, ADHD (Dr. Greig Mages), migraines, mass RT breast (MMG through [facility] 06/2020.  Repeat in 6 mths)   Discussed the use of AI scribe software for clinical note transcription with the patient, who gave verbal consent to proceed.  History of Present Illness Evelyn Lawson is a 43 year old female who presents for a routine physical exam and follow-up on hypertension and mental health.  She manages her hypertension with amlodipine  10 mg daily. Her blood pressure was last checked two months ago, recording 124 mmHg systolic. She is actively trying to limit her salt intake.  She was previously referred to behavioral health due to paranoia and hallucinations, which resolved after discontinuing trazodone . She did not see BH specialist. She is currently stable on Zoloft  for PMS, with no current issues reported.  She has lost weight, going from 179 lbs in [place] to 175 lbs today. She is working on her eating habits, attempting to reduce soda, bread, and sugar intake. She uses a vibration plate, a mini stair stepper, and a mini bike for exercise. She participates in a free exercise therapy program through [employer] called [facility], focusing on pelvic exercises to strengthen her pelvic floor.  Has hx of vit D def.  She has not been taking vitamin D  supplements recently but takes a multivitamin gummy.   HM: She has a mammogram scheduled for August 18th.    Patient Active Problem List   Diagnosis Date Noted   PMS (premenstrual syndrome) 03/02/2021   Mixed hyperlipidemia 11/30/2020   Stressful life  event affecting family 11/30/2020   Thyroid  nodule 11/30/2020   Multinodular goiter 09/25/2020   Encounter for sterilization 08/17/2020   Essential hypertension 08/01/2020   Thyroid  enlargement 08/01/2020   Vitamin D  deficiency 08/01/2020   Influenza vaccination declined 08/01/2020   External hemorrhoids with irritation/pain 01/31/2014   Constipation, chronic 01/31/2014   Internal hemorrhoids with complication 01/31/2014     Current Outpatient Medications on File Prior to Visit  Medication Sig Dispense Refill   albuterol  (VENTOLIN  HFA) 108 (90 Base) MCG/ACT inhaler Inhale 2 puffs into the lungs every 6 (six) hours as needed for wheezing or shortness of breath.     amLODipine  (NORVASC ) 10 MG tablet TAKE 1 TABLET BY MOUTH EVERY DAY 90 tablet 1   amphetamine-dextroamphetamine (ADDERALL XR) 25 MG 24 hr capsule Take 50 mg by mouth every morning.     amphetamine-dextroamphetamine (ADDERALL) 20 MG tablet Take 10-20 mg by mouth daily as needed (attention/focus (afternoons)).     cetirizine (ZYRTEC) 10 MG tablet Take 10 mg by mouth daily.     famotidine  (PEPCID ) 20 MG tablet Take 1 tablet (20 mg total) by mouth 2 (two) times daily. (Patient taking differently: Take 20 mg by mouth 2 (two) times daily as needed for heartburn or indigestion.) 30 tablet 0   Multiple Vitamin (MULTIVITAMIN WITH MINERALS) TABS tablet Take 1 tablet by mouth daily. Women's Multivitamin     Multiple Vitamin (MULTIVITAMIN) capsule Take 1 capsule by mouth daily.     omeprazole  (PRILOSEC) 20 MG capsule Take 1 capsule (20 mg total) by mouth  daily as needed (indigestion/heartburn.). 30 capsule 3   oxyCODONE  (OXY IR/ROXICODONE ) 5 MG immediate release tablet Take 1 tablet (5 mg total) by mouth every 6 (six) hours as needed for severe pain. 15 tablet 0   propranolol  ER (INDERAL  LA) 160 MG SR capsule TAKE 1 CAPSULE BY MOUTH DAILY. 90 capsule 1   Saccharomyces boulardii (PROBIOTIC) 250 MG CAPS Take 250 mg by mouth.     sertraline   (ZOLOFT ) 50 MG tablet Take 1 tablet (50 mg total) by mouth daily. 90 tablet 1   No current facility-administered medications on file prior to visit.    Allergies  Allergen Reactions   Other Swelling and Rash    Peppers/HIGHLY ACIDIC FOODS   Tomato Swelling and Rash    Paste is fine     Social History   Socioeconomic History   Marital status: Single    Spouse name: Not on file   Number of children: 1   Years of education: Not on file   Highest education level: Bachelor's degree (e.g., BA, AB, BS)  Occupational History   Occupation: HR specialist  Tobacco Use   Smoking status: Never   Smokeless tobacco: Never  Vaping Use   Vaping status: Never Used  Substance and Sexual Activity   Alcohol use: Yes    Comment: social   Drug use: Never   Sexual activity: Not on file  Other Topics Concern   Not on file  Social History Narrative   ** Merged History Encounter **       Social Drivers of Health   Financial Resource Strain: Low Risk  (04/16/2024)   Overall Financial Resource Strain (CARDIA)    Difficulty of Paying Living Expenses: Not hard at all  Food Insecurity: No Food Insecurity (04/16/2024)   Hunger Vital Sign    Worried About Running Out of Food in the Last Year: Never true    Ran Out of Food in the Last Year: Never true  Transportation Needs: No Transportation Needs (04/16/2024)   PRAPARE - Administrator, Civil Service (Medical): No    Lack of Transportation (Non-Medical): No  Physical Activity: Insufficiently Active (04/16/2024)   Exercise Vital Sign    Days of Exercise per Week: 3 days    Minutes of Exercise per Session: 30 min  Stress: Stress Concern Present (04/16/2024)   [employer] of Occupational Health - Occupational Stress Questionnaire    Feeling of Stress: To some extent  Social Connections: Socially Isolated (04/16/2024)   Social Connection and Isolation Panel    Frequency of Communication with Friends and Family: More than three times  a week    Frequency of Social Gatherings with Friends and Family: More than three times a week    Attends Religious Services: Never    Database administrator or Organizations: No    Attends Engineer, structural: Not on file    Marital Status: Never married  Intimate Partner Violence: Not At Risk (10/17/2023)   Humiliation, Afraid, Rape, and Kick questionnaire    Fear of Current or Ex-Partner: No    Emotionally Abused: No    Physically Abused: No    Sexually Abused: No    Family History  Problem Relation Age of Onset   Cancer Maternal Grandmother        breast   Breast cancer Maternal Grandmother    Cancer Paternal Grandmother        breast   Breast cancer Paternal Grandmother    Cancer  Paternal Grandfather        colon   Diabetes Maternal Grandfather    Breast cancer Maternal Aunt    Thyroid  disease Neg Hx     Past Surgical History:  Procedure Laterality Date   BREAST ENHANCEMENT SURGERY  11/30/2008   CHOLECYSTECTOMY N/A 04/17/2023   Procedure: LAPAROSCOPIC CHOLECYSTECTOMY with ICG;  Surgeon: Tanda Locus, MD;  Location: [facility];  Service: General;  Laterality: N/A;   HEMORRHOID SURGERY  2013   LAPAROSCOPIC TUBAL LIGATION Bilateral 08/25/2020   Procedure: LAPAROSCOPIC TUBAL LIGATION WITH BIPOLAR CAUTERY;  Surgeon: Rutherford Gain, MD;  Location: [facility];  Service: Gynecology;  Laterality: Bilateral;   WISDOM TOOTH EXTRACTION      ROS: Review of Systems  HENT:  Negative for congestion, dental problem and trouble swallowing.        Gets routine dental exams twice a year.  Eyes:        Recently got new contacts and has had problems seeing things on her computer at work with the new prescriptions.  Respiratory:  Negative for cough and shortness of breath.   Cardiovascular:  Negative for chest pain.  Gastrointestinal:  Negative for abdominal pain, blood in stool, constipation and diarrhea.  Genitourinary:  Negative for difficulty urinating,  hematuria and menstrual problem.  Musculoskeletal:  Negative for arthralgias.   Negative except as stated above  PHYSICAL EXAM: BP 122/81   Pulse 65   Ht 5' 6 (1.676 m)   Wt 175 lb (79.4 kg)   SpO2 100%   BMI 28.25 kg/m   Wt Readings from Last 3 Encounters:  04/16/24 175 lb (79.4 kg)  01/08/24 179 lb (81.2 kg)  10/17/23 177 lb (80.3 kg)    Physical Exam  General appearance - alert, well appearing, middle-age African-American female and in no distress Mental status - normal mood, behavior, speech, dress, motor activity, and thought processes Eyes - pupils equal and reactive, extraocular eye movements intact Ears - bilateral TM's and external ear canals normal Nose - normal and patent, no erythema, discharge or polyps Mouth - mucous membranes moist, pharynx normal without lesions Neck - supple, no significant adenopathy Lymphatics - no palpable lymphadenopathy, no hepatosplenomegaly Chest - clear to auscultation, no wheezes, rales or rhonchi, symmetric air entry Heart - normal rate, regular rhythm, normal S1, S2, no murmurs, rubs, clicks or gallops Abdomen - soft, nontender, nondistended, no masses or organomegaly Neurological - alert, oriented, normal speech, no focal findings or movement disorder noted Musculoskeletal - no joint tenderness, deformity or swelling Extremities - peripheral pulses normal, no pedal edema, no clubbing or cyanosis Skin - normal coloration and turgor, no rashes, no suspicious skin lesions noted      Latest Ref Rng & Units 01/08/2024   11:28 AM 04/17/2023    1:25 PM 08/05/2022   11:32 AM  CMP  Glucose 70 - 99 mg/dL 83  89  96   BUN 6 - 24 mg/dL 9  11  13    Creatinine 0.57 - 1.00 mg/dL 9.13  9.12  8.94   Sodium 134 - 144 mmol/L 140  138  138   Potassium 3.5 - 5.2 mmol/L 4.1  3.3  4.1   Chloride 96 - 106 mmol/L 99  104  99   CO2 20 - 29 mmol/L 26  25  25    Calcium 8.7 - 10.2 mg/dL 9.9  9.2  9.9   Total Protein 6.0 - 8.5 g/dL 7.4   7.3   Total  Bilirubin  0.0 - 1.2 mg/dL 0.3   0.6   Alkaline Phos 44 - 121 IU/L 78   68   AST 0 - 40 IU/L 17   18   ALT 0 - 32 IU/L 10   13    Lipid Panel     Component Value Date/Time   CHOL 227 (H) 08/05/2022 1132   TRIG 72 08/05/2022 1132   HDL 96 08/05/2022 1132   CHOLHDL 2.4 08/05/2022 1132   LDLCALC 119 (H) 08/05/2022 1132    CBC    Component Value Date/Time   WBC 6.6 01/08/2024 1128   WBC 5.8 08/25/2020 1222   RBC 4.26 01/08/2024 1128   RBC 4.79 08/25/2020 1222   HGB 13.3 01/08/2024 1128   HCT 40.9 01/08/2024 1128   PLT 358 01/08/2024 1128   MCV 96 01/08/2024 1128   MCH 31.2 01/08/2024 1128   MCH 32.2 08/25/2020 1222   MCHC 32.5 01/08/2024 1128   MCHC 32.2 08/25/2020 1222   RDW 11.9 01/08/2024 1128   LYMPHSABS 1.9 08/02/2021 1440   EOSABS 0.2 08/02/2021 1440   BASOSABS 0.1 08/02/2021 1440    ASSESSMENT AND PLAN: 1. Annual physical exam (Primary) Encourage patient to continue trying to eat healthy and to take small steps so that she does not become overwhelmed and discouraged.  Continue regular exercise.  Advised to go back to her eye doctor to have her contact lens prescription adjusted  2. Essential hypertension Close to goal on repeat blood pressure check.  Continue amlodipine   3. Overweight (BMI 25.0-29.9) See #1 above  4. Need for vaccination against Streptococcus pneumoniae - Pneumococcal conjugate vaccine 20-valent  5. Need for hepatitis B vaccination Patient agreeable to receiving hepatitis B vaccine series.  She received the first shot today.  Will have her scheduled to see the nurse in 1 month for the second of 3.   Patient was given the opportunity to ask questions.  Patient verbalized understanding of the plan and was able to repeat key elements of the plan.   This documentation was completed using Paediatric nurse.  Any transcriptional errors are unintentional.  Orders Placed This Encounter  Procedures   Pneumococcal conjugate vaccine  20-valent   Heplisav-B (HepB-CPG) Vaccine     Requested Prescriptions    No prescriptions requested or ordered in this encounter    Return in about 4 months (around 08/17/2024) for Give appt with RN in 1 mth for 2nd Hep B vacc.  Barnie Louder, MD, FACP

## 2024-04-16 NOTE — Patient Instructions (Signed)
 VISIT SUMMARY:  Today, you had a routine physical exam and follow-up for your hypertension and mental health. We discussed your current medications, lifestyle habits, and any new symptoms or concerns you have. Your blood pressure is well-managed, and your mental health is stable. You have made progress with your weight management and exercise routine. We also reviewed your vision issues and general health maintenance needs.  YOUR PLAN:  -HYPERTENSION: Hypertension means high blood pressure. Your blood pressure is within the target range with your current medication, amlodipine  10 mg daily. Continue taking this medication, monitor your blood pressure regularly, and keep limiting your salt intake.  -DEPRESSION: Depression is a mental health condition characterized by persistent feelings of sadness and loss of interest. Your depression is well-managed with Zoloft , and no changes are needed at this time.  -PARANOIA AND HALLUCINATIONS: Paranoia and hallucinations are symptoms where a person may feel irrational distrust or see/hear things that aren't there. These symptoms have resolved after stopping trazodone , so we will remove trazodone  from your medication list.  -WEIGHT MANAGEMENT: Weight management involves maintaining a healthy weight through diet and exercise. You have lost weight and are improving your eating habits. Continue making gradual dietary changes, keep up with your current exercise routine, and participate in your health program for pelvic exercises.  -VISION ISSUES: You are experiencing difficulty seeing with your new contact lenses, which is causing eye strain. Follow up with your eye care provider to address these vision issues.  -GENERAL HEALTH MAINTENANCE: General health maintenance includes routine vaccinations, lab tests, and exams to keep you healthy. You are due for pneumonia and hepatitis B vaccines, and we will reassess your vitamin D  and cholesterol levels in four months.  Please schedule a dental exam and continue with your scheduled mammogram on August 18th.  INSTRUCTIONS:  - Administer pneumonia vaccine. - Initiate hepatitis B vaccine series. - Schedule follow-up for second hepatitis B vaccine in one month. - Encourage dental exam. - Reassess vitamin D  and cholesterol levels in four months. - Continue with scheduled mammogram on August 18th.

## 2024-04-29 ENCOUNTER — Ambulatory Visit: Payer: Self-pay | Admitting: *Deleted

## 2024-04-29 NOTE — Telephone Encounter (Signed)
 FYI Only or Action Required?: FYI only for provider.  Patient was last seen in primary care on 04/16/2024 by Vicci Barnie NOVAK, MD.  Called Nurse Triage reporting Seizures.  Symptoms began today.  Interventions attempted: Nothing.  Symptoms are: stable.  Triage Disposition: ED advised   Patient/caregiver understands and will follow disposition?: yes   Reason for Disposition  Patient sounds very sick or weak to the triager    Patient has hx childhood seizure- patient is OOT- presently traveling in car. Advised family/patient-ED for evaluation- they agree. Will call PCP after for appointment  Answer Assessment - Initial Assessment Questions 1. ONSET: When did the seizure occur?     Today 7:00 2. DURATION: How long did the seizure last (or how long has it been happening)? (e.g., seconds, minutes)  Note: Most seizures last less than 5 minutes.     20 minute- whole episode, seizure few minutes 3. DESCRIPTION: Describe what happened during the seizure. Did the body become stiff? Was there any jerking?  Did they lose consciousness during the seizure?     Felt faint-started jerking motions, incontinence of urine, sweating, R side tingling 4. CIRCUMSTANCE: What was the person doing when the seizure began?      Laying down- got up abruptly  5. MENTAL STATUS AFTER SEIZURE: Does the person seem more groggy or sleepy? Does the person know who they are, who you are, and where they are now?      Eye feels droopy- jaw pain afterward, patient reports feeling ok 6. PRIOR SEIZURES: Has the person had a seizure (convulsion) before? (e.g., epilepsy, other cause)  If Yes, ask: When was the last time? and What happened last time?      Hx seizure as child- temperature driven 7. EPILEPSY: Does the person have epilepsy? Note: Check for medical ID bracelet.     no 8. MEDICINES: Does the person take anticonvulsant medications? (e.g., Yes, No; missed doses, any recent changes)      no 9. INJURY: Was the person hurt or injured during the seizure? (e.g., hit their head, bit their tongue)     no 10. OTHER SYMPTOMS: Are there any other symptoms? (e.g., fever, headache)       no  Protocols used: [facility]   Copied from [employer] #[id number]. Topic: Clinical - Red Word Triage >> Apr 29, 2024 10:53 AM Travis F wrote: Red Word that prompted transfer to Nurse Triage: Patient's mother is calling in because this morning around 8:30, she fell and when she fell patient jerked like she was going to have a seizure, urinated on herself, and was drenched with sweat.

## 2024-05-11 ENCOUNTER — Encounter: Payer: Self-pay | Admitting: Cardiology

## 2024-05-11 ENCOUNTER — Ambulatory Visit: Attending: Cardiology | Admitting: Cardiology

## 2024-05-11 ENCOUNTER — Ambulatory Visit (INDEPENDENT_AMBULATORY_CARE_PROVIDER_SITE_OTHER)

## 2024-05-11 ENCOUNTER — Encounter: Payer: Self-pay | Admitting: Internal Medicine

## 2024-05-11 VITALS — BP 98/72 | HR 70 | Ht 66.0 in | Wt 177.6 lb

## 2024-05-11 DIAGNOSIS — R55 Syncope and collapse: Secondary | ICD-10-CM | POA: Diagnosis not present

## 2024-05-11 DIAGNOSIS — R569 Unspecified convulsions: Secondary | ICD-10-CM | POA: Diagnosis not present

## 2024-05-11 DIAGNOSIS — I10 Essential (primary) hypertension: Secondary | ICD-10-CM

## 2024-05-11 NOTE — Progress Notes (Signed)
 Cardiology Office Note   Date:  05/11/2024  ID:  Evelyn Lawson, DOB 1981/08/09, MRN [id number] PCP: Vicci Barnie NOVAK, MD  [facility] Providers Cardiologist:  Vina Gull, MD  History of Present Illness Evelyn Lawson is a 43 y.o. female with past medical history hypertension, vitamin D  deficiency, hyperlipidemia, ADHD, migraines, mass in right breast.  Patient presents today for evaluation of syncope.  Patient had been seen by her PCP in 12/2023.  At that time reported having a syncope episode that occurred 6 nights prior to when she was seen.  Occurred in this context of upper respiratory infection.  Does not think that she was dehydrated at that time.  She had woken up to use the bathroom in the middle of the night, was standing between the sink in the tub.  Had a feeling of everything going black and she attempted to reach for something.  When she woke up, she had fallen backwards. She was referred to cardiology for further evaluation   On interview, patient reports that she has had 2 episodes of syncope in the past 4 months.  In [place] she had an episode when she was sick with an upper respiratory infection.  Reported that she had woken up to use the bathroom, walk to the bathroom, and passed out.  Prior to passing out she had some lightheadedness.  She was able to walk herself back to bed.  About 2 weeks ago she had had another episode of syncope.  Reports that she had been at the beach with her mom.  She was helping her mom walk down some stairs when she started to feel lightheaded.  She again lost consciousness.  Her family was able to help her back in the house.  Patient denies chest pain, palpitations.  She denies dizziness upon standing.  Her blood pressure today is 98/72 but has more recently been in the 120s systolic.  She notes that during her more recent episode of syncope, her mother witnessed it and thought that she may have been having a seizure.  Patient reports that she had  febrile seizures as a child.  Her mother reports that when she syncopized a few weeks ago, she had shaking and her eyes rolled back into her head.  Patient also reports that she urinated on herself and was very dazed after the event.  Her mother had witnessed her having febrile seizures as a child and says that her presentation was very similar. Denies chest pain, DOE.    Studies Reviewed       Risk Assessment/Calculations           Physical Exam VS:  BP 98/72   Pulse 70   Ht 5' 6 (1.676 m)   Wt 177 lb 9.6 oz (80.6 kg)   LMP 04/27/2024   SpO2 98%   BMI 28.67 kg/m        Wt Readings from Last 3 Encounters:  05/11/24 177 lb 9.6 oz (80.6 kg)  04/16/24 175 lb (79.4 kg)  01/08/24 179 lb (81.2 kg)    GEN: Well nourished, well developed in no acute distress. Sitting upright on the exam table  NECK: No JVD; No carotid bruits CARDIAC:  RRR, no murmurs, rubs, gallops. Radial pulses 2+ bilaterally  RESPIRATORY:  Clear to auscultation without rales, wheezing or rhonchi  ABDOMEN: Soft, non-tender, non-distended EXTREMITIES:  No edema in BLE; No deformity   ASSESSMENT AND PLAN  Syncope  Possible Seizure-like activity  - Patient previously  had an episode of syncope in 12/2023 while infected with a URI. More recently had an episode about 2 weeks ago while at the beach. Both episodes had a prodrome of lightheadedness  - More recent syncopal episode was witnessed by patient's mother- patient had febrile seizures as a child, mom reports that her syncopal episode was very similar to seizures in the past. Patient shook, eyes rolled back in her head, she urinated on herself, and she had confusion after the event  - Referral placed to neurology for possible seizure  - Ordered 14 day zio - Ordered echocardiogram  - Patient's BP 98/72 today in clinic, but is usually in the 120s. She is not having symptoms of orthostatic hypotension. Instructed her to monitor her BP at home and if it continues to  be in the 90s systolic, we can decrease her amlodipine  to 5 mg daily  - Hemoglobin 13.3, potassium 4.1, creatinine 0.86 in 12/2023  - I did discuss the [place] DMV medical guidelines for driving: it is prudent to recommend that all persons should be free of syncopal episodes for at least six months to be granted the driving privilege. (THE [place]  PHYSICIAN'S GUIDE TO DRIVER MEDICAL EVALUATION, Second Edition, Medical Review Branch, Associate Professor, Division of [employer], [place]  Department of Transportation, July 2004)  HTN  - Managed by PCP- patient reports that her amlodipine  was started due to elevated diastolic BP. BP a bit low today in clinic at 98/72. No symptoms of orthostatic hypotension  - BP has been in the 120s/80s-90s at recent outpatient visits  - OK to continue amlodipine  10 mg daily and propranolol  160 mg daily for now. Instructed her to monitor her BP closely at home, and if it continues to be low we will reduce amlodipine     Dispo: Follow up in 3 months with Dr. Okey   Signed, Rollo FABIENE Louder, PA-C

## 2024-05-11 NOTE — Progress Notes (Unsigned)
 Enrolled patient for a 14 day Zio XT  monitor to be mailed to patients home

## 2024-05-11 NOTE — Patient Instructions (Signed)
 Medication Instructions:  No changes *If you need a refill on your cardiac medications before your next appointment, please call your pharmacy*  Lab Work: No labs If you have labs (blood work) drawn today and your tests are completely normal, you will receive your results only by: MyChart Message (if you have MyChart) OR A paper copy in the mail If you have any lab test that is abnormal or we need to change your treatment, we will call you to review the results.  Testing/Procedures: Your physician has requested that you have an echocardiogram. Echocardiography is a painless test that uses sound waves to create images of your heart. It provides your doctor with information about the size and shape of your heart and how well your heart's chambers and valves are working. This procedure takes approximately one hour. There are no restrictions for this procedure. Please do NOT wear cologne, perfume, aftershave, or lotions (deodorant is allowed). Please arrive 15 minutes prior to your appointment time.  Please note: We ask at that you not bring children with you during ultrasound (echo/ vascular) testing. Due to room size and safety concerns, children are not allowed in the ultrasound rooms during exams. Our front office staff cannot provide observation of children in our lobby area while testing is being conducted. An adult accompanying a patient to their appointment will only be allowed in the ultrasound room at the discretion of the ultrasound technician under special circumstances. We apologize for any inconvenience.  Follow-Up: At [facility], you and your health needs are our priority.  As part of our continuing mission to provide you with exceptional heart care, our providers are all part of one team.  This team includes your primary Cardiologist (physician) and Advanced Practice Providers or APPs (Physician Assistants and Nurse Practitioners) who all work together to provide you with  the care you need, when you need it.  Your next appointment:   3 month(s)  Provider:   Vina Gull, MD    We recommend signing up for the patient portal called MyChart.  Sign up information is provided on this After Visit Summary.  MyChart is used to connect with patients for Virtual Visits (Telemedicine).  Patients are able to view lab/test results, encounter notes, upcoming appointments, etc.  Non-urgent messages can be sent to your provider as well.   To learn more about what you can do with MyChart, go to [email or web address].   Other Instructions ZIO XT- Long Term Monitor Instructions  Your physician has requested you wear a ZIO patch monitor for 14 days.  This is a single patch monitor. [employer] supplies one patch monitor per enrollment. Additional stickers are not available. Please do not apply patch if you will be having a Nuclear Stress Test,  Echocardiogram, Cardiac CT, MRI, or Chest Xray during the period you would be wearing the  monitor. The patch cannot be worn during these tests. You cannot remove and re-apply the  ZIO XT patch monitor.  Your ZIO patch monitor will be mailed 3 day USPS to your address on file. It may take 3-5 days  to receive your monitor after you have been enrolled.  Once you have received your monitor, please review the enclosed instructions. Your monitor  has already been registered assigning a specific monitor serial # to you.  Billing and Patient Assistance Program Information  We have supplied [employer] with any of your insurance information on file for billing purposes. [employer] offers a sliding scale Patient Assistance Program for patients  that do not have  insurance, or whose insurance does not completely cover the cost of the ZIO monitor.  You must apply for the Patient Assistance Program to qualify for this discounted rate.  To apply, please call [employer] at [phone], select option 4, select option 2, ask to apply for  Patient Assistance  Program. Meredeth will ask your household income, and how many people  are in your household. They will quote your out-of-pocket cost based on that information.  [employer] will also be able to set up a 47-month, interest-free payment plan if needed.  Applying the monitor   Shave hair from upper left chest.  Hold abrader disc by orange tab. Rub abrader in 40 strokes over the upper left chest as  indicated in your monitor instructions.  Clean area with 4 enclosed alcohol pads. Let dry.  Apply patch as indicated in monitor instructions. Patch will be placed under collarbone on left  side of chest with arrow pointing upward.  Rub patch adhesive wings for 2 minutes. Remove white label marked 1. Remove the white  label marked 2. Rub patch adhesive wings for 2 additional minutes.  While looking in a mirror, press and release button in center of patch. A small green light will  flash 3-4 times. This will be your only indicator that the monitor has been turned on.  Do not shower for the first 24 hours. You may shower after the first 24 hours.  Press the button if you feel a symptom. You will hear a small click. Record Date, Time and  Symptom in the Patient Logbook.  When you are ready to remove the patch, follow instructions on the last 2 pages of Patient  Logbook. Stick patch monitor onto the last page of Patient Logbook.  Place Patient Logbook in the blue and white box. Use locking tab on box and tape box closed  securely. The blue and white box has prepaid postage on it. Please place it in the mailbox as  soon as possible. Your physician should have your test results approximately 7 days after the  monitor has been mailed back to [facility].  Call [facility] Customer Care at [phone] if you have questions regarding  your ZIO XT patch monitor. Call them immediately if you see an orange light blinking on your  monitor.  If your monitor falls off in less than 4 days, contact our  Monitor department at [phone].  If your monitor becomes loose or falls off after 4 days call [employer] at [phone] for  suggestions on securing your monitor

## 2024-05-21 ENCOUNTER — Ambulatory Visit: Attending: Family Medicine

## 2024-05-21 DIAGNOSIS — Z23 Encounter for immunization: Secondary | ICD-10-CM | POA: Diagnosis not present

## 2024-05-21 NOTE — Progress Notes (Signed)
 Hep B vaccine administered in right deltoid per protocols.  Information sheet given. Patient denies and pain or discomfort at injection site. Tolerated injection well no reaction.

## 2024-06-03 ENCOUNTER — Ambulatory Visit: Admitting: Internal Medicine

## 2024-06-03 ENCOUNTER — Telehealth: Payer: Self-pay | Admitting: Internal Medicine

## 2024-06-03 NOTE — Telephone Encounter (Signed)
-----   Message from Barnie Louder sent at 06/02/2024  8:24 PM EDT ----- Regarding: UC appt Please give pt an UC appt with me sometime within the next 1-2 wks. Thanks.

## 2024-06-03 NOTE — Telephone Encounter (Signed)
 Called patient, Appointment scheduled for 07/08/2024 at 2:10 pm.

## 2024-06-10 ENCOUNTER — Ambulatory Visit (HOSPITAL_COMMUNITY)
Admission: RE | Admit: 2024-06-10 | Discharge: 2024-06-10 | Disposition: A | Discharge status: Home / Self Care | Source: Ambulatory Visit | Attending: Internal Medicine | Admitting: Internal Medicine

## 2024-06-10 ENCOUNTER — Other Ambulatory Visit: Payer: Self-pay | Admitting: Internal Medicine

## 2024-06-10 ENCOUNTER — Other Ambulatory Visit: Payer: Self-pay

## 2024-06-10 DIAGNOSIS — R569 Unspecified convulsions: Secondary | ICD-10-CM | POA: Diagnosis present

## 2024-06-10 DIAGNOSIS — R55 Syncope and collapse: Secondary | ICD-10-CM | POA: Diagnosis present

## 2024-06-10 DIAGNOSIS — I10 Essential (primary) hypertension: Secondary | ICD-10-CM

## 2024-06-10 LAB — ECHOCARDIOGRAM COMPLETE
Area-P 1/2: 4.17 cm2
S' Lateral: 2.3 cm

## 2024-06-10 MED ORDER — PROPRANOLOL HCL ER 160 MG PO CP24
160.0000 mg | ORAL_CAPSULE | Freq: Every day | ORAL | 1 refills | Status: AC
  Filled 2024-06-10 (×2): qty 90, 90d supply, fill #0

## 2024-06-11 ENCOUNTER — Other Ambulatory Visit: Payer: Self-pay

## 2024-06-11 ENCOUNTER — Ambulatory Visit: Payer: Self-pay | Admitting: Cardiology

## 2024-07-02 ENCOUNTER — Other Ambulatory Visit: Payer: Self-pay | Admitting: *Deleted

## 2024-07-02 DIAGNOSIS — Z006 Encounter for examination for normal comparison and control in clinical research program: Secondary | ICD-10-CM

## 2024-07-08 ENCOUNTER — Ambulatory Visit: Admitting: Internal Medicine

## 2024-08-02 ENCOUNTER — Ambulatory Visit: Attending: Internal Medicine | Admitting: Internal Medicine

## 2024-08-02 ENCOUNTER — Encounter: Payer: Self-pay | Admitting: Internal Medicine

## 2024-08-02 VITALS — BP 114/71 | HR 71 | Ht 66.0 in | Wt 178.6 lb

## 2024-08-02 DIAGNOSIS — R55 Syncope and collapse: Secondary | ICD-10-CM

## 2024-08-02 NOTE — Progress Notes (Addendum)
 Cardiology Office Note   Date:  08/02/2024   ID:  Evelyn Lawson, DOB 1981/03/15, MRN [id number]  PCP:  Vicci Barnie NOVAK, MD  Cardiologist:   Vina Gull, MD   Pt presents for follow up of dizziness    History of Present Illness: Evelyn Lawson is a 43 y.o. female with a history of syncope,  HTN, HL, migraine HA.  [name] 2025   PT had episode of syncope  Occurred in context of URI   Woke up to use the BR   Was standing in BR when she bassed out   Felt things going black before she passed out.   After that spell had another episode  Occurred in AM   Had not had anything to eat.  Was helping mom down the stairs.   Felt things going black  Passed out    Per family, eyes rolled back in head, shaking   She was seen by MARLA Vicci in Aug 2025   Referred to neuro   Zio patch and echo ordered.     Since then the pt says she has had no further spells   Overall feeing OK  No CP  No significant dizziness  The pt had one syncopal spell in July 2021   Was dehydrated at the time         Current Meds  Medication Sig   albuterol  (VENTOLIN  HFA) 108 (90 Base) MCG/ACT inhaler Inhale 2 puffs into the lungs every 6 (six) hours as needed for wheezing or shortness of breath.   amLODipine  (NORVASC ) 10 MG tablet TAKE 1 TABLET BY MOUTH EVERY DAY   amphetamine-dextroamphetamine (ADDERALL XR) 25 MG 24 hr capsule Take 50 mg by mouth every morning.   amphetamine-dextroamphetamine (ADDERALL) 20 MG tablet Take 10-20 mg by mouth daily as needed (attention/focus (afternoons)).   cetirizine (ZYRTEC) 10 MG tablet Take 10 mg by mouth daily.   Multiple Vitamin (MULTIVITAMIN WITH MINERALS) TABS tablet Take 1 tablet by mouth daily. Women's Multivitamin   propranolol  ER (INDERAL  LA) 160 MG SR capsule Take 1 capsule (160 mg total) by mouth daily.   sertraline  (ZOLOFT ) 50 MG tablet Take 1 tablet (50 mg total) by mouth daily.     Allergies:   Other and Tomato   Past Medical History:  Diagnosis Date   ADHD  (attention deficit hyperactivity disorder)    Enlarged thyroid     ultasound in epic 08-07-2020  bilateral nodules   (08-16-2020 per pt pcp has referred her to an endocrinologist)   GERD (gastroesophageal reflux disease)    Heart murmur    Hemorrhoids    History of pneumonia    History of seizures as a child    last febrile seizure age 89   Hypertension    followed by pcp   Migraines    Numbness and tingling of both feet    Social anxiety disorder    Thyroid  nodule    Wears contact lenses     Past Surgical History:  Procedure Laterality Date   BREAST ENHANCEMENT SURGERY  11/30/2008   CHOLECYSTECTOMY N/A 04/17/2023   Procedure: LAPAROSCOPIC CHOLECYSTECTOMY with ICG;  Surgeon: Tanda Locus, MD;  Location: [facility];  Service: General;  Laterality: N/A;   HEMORRHOID SURGERY  2013   LAPAROSCOPIC TUBAL LIGATION Bilateral 08/25/2020   Procedure: LAPAROSCOPIC TUBAL LIGATION WITH BIPOLAR CAUTERY;  Surgeon: Rutherford Gain, MD;  Location: [facility] [place];  Service: Gynecology;  Laterality: Bilateral;   WISDOM TOOTH EXTRACTION  Social History:  The patient  reports that she has never smoked. She has never used smokeless tobacco. She reports current alcohol use. She reports that she does not use drugs.   Family History:  The patient's family history includes Breast cancer in her maternal aunt, maternal grandmother, and paternal grandmother; Cancer in her maternal grandmother, paternal grandfather, and paternal grandmother; Diabetes in her maternal grandfather.    ROS:  Please see the history of present illness. All other systems are reviewed and  Negative to the above problem except as noted.    PHYSICAL EXAM: VS:  BP 114/71 (BP Location: Left Arm, Patient Position: Sitting, Cuff Size: Large)   Pulse 71   Ht 5' 6 (1.676 m)   Wt 178 lb 9.6 oz (81 kg)   SpO2 98%   BMI 28.83 kg/m    BP laying  130/85  P 67   Sitting  127/86  P 72  Standing 129/90  P 74    Standing 4  min 124/88  P 80    GEN: Well nourished, well developed, in no acute distress  HEENT: normal  Neck: no JVD, carotid bruits, or masses Cardiac: RRR; no murmurs, rubs, or gallops,no edema  Respiratory:  clear to auscultation bilaterally, normal work of breathing GI: soft, nontender, nondistended, + BS  No hepatomegaly  MS: no deformity Moving all extremities   Skin: warm and dry, no rash Neuro:  Strength and sensation are intact Psych: euthymic mood, full affect   EKG:  EKG is not ordered today.  Zio patch   Sept 2025  Patch Wear Time:  13 days and 23 hours (2025-08-22T09:33:34-0400 to 2025-09-05T09:33:26-0400)   Impression:  Sinus rhythm  Rate 52 to 113 bpm   Average HR 72 bpm Rare PVC Rare PAC Diary entries/triggered events correlated with sinus rhythm    Echo  Sept 2025   1. Left ventricular ejection fraction, by estimation, is 65 to 70%. Left  ventricular ejection fraction by 3D volume is 69 %. The left ventricle has  normal function. The left ventricle has no regional wall motion  abnormalities. Left ventricular diastolic   parameters were normal. The average left ventricular global longitudinal  strain is -22.7 %. The global longitudinal strain is normal.   2. Right ventricular systolic function is normal. The right ventricular  size is normal. There is normal pulmonary artery systolic pressure. The  estimated right ventricular systolic pressure is 33.4 mmHg.   3. The mitral valve is grossly normal. No evidence of mitral valve  regurgitation.   4. The aortic valve is tricuspid. Aortic valve regurgitation is not  visualized.   5. The inferior vena cava is normal in size with <50% respiratory  variability, suggesting right atrial pressure of 8 mmHg.    Lipid Panel    Component Value Date/Time   CHOL 227 (H) 08/05/2022 1132   TRIG 72 08/05/2022 1132   HDL 96 08/05/2022 1132   CHOLHDL 2.4 08/05/2022 1132   LDLCALC 119 (H) 08/05/2022 1132      Wt Readings from  Last 3 Encounters:  08/02/24 178 lb 9.6 oz (81 kg)  05/11/24 177 lb 9.6 oz (80.6 kg)  04/16/24 175 lb (79.4 kg)      ASSESSMENT AND PLAN:  1  Dizziness/syncope   PT has had 2 spells  Both sound more orthostatic though she is not on exam today   I have told her to stay hydrated, avoid getting up quickly, especially in middle of night  ANd if feeling any dizziness, sit down   The pt is on 2 meds for BP   May need to cut back  PT has appt in neuro     Follow   2  HTN  Blood pressure is ok on current meds        Current medicines are reviewed at length with the patient today.  The patient does not have concerns regarding medicines.  Signed, Vina Gull, MD

## 2024-08-02 NOTE — Patient Instructions (Signed)
 Medication Instructions:  Your physician recommends that you continue on your current medications as directed. Please refer to the Current Medication list given to you today.  *If you need a refill on your cardiac medications before your next appointment, please call your pharmacy*  Follow-Up: At [facility], you and your health needs are our priority.  As part of our continuing mission to provide you with exceptional heart care, our providers are all part of one team.  This team includes your primary Cardiologist (physician) and Advanced Practice Providers or APPs (Physician Assistants and Nurse Practitioners) who all work together to provide you with the care you need, when you need it.  Your next appointment:   To be determined  Provider:   Vina Gull, MD  We recommend signing up for the patient portal called MyChart.  Sign up information is provided on this After Visit Summary.  MyChart is used to connect with patients for Virtual Visits (Telemedicine).  Patients are able to view lab/test results, encounter notes, upcoming appointments, etc.  Non-urgent messages can be sent to your provider as well.    To learn more about what you can do with MyChart, go to [email or web address].

## 2024-08-13 ENCOUNTER — Ambulatory Visit: Payer: Self-pay | Admitting: Neurology

## 2024-08-13 ENCOUNTER — Encounter: Payer: Self-pay | Admitting: Neurology

## 2024-08-13 VITALS — BP 114/76 | HR 71 | Ht 66.0 in | Wt 176.0 lb

## 2024-08-13 DIAGNOSIS — I7300 Raynaud's syndrome without gangrene: Secondary | ICD-10-CM

## 2024-08-13 DIAGNOSIS — R55 Syncope and collapse: Secondary | ICD-10-CM | POA: Diagnosis not present

## 2024-08-13 DIAGNOSIS — G479 Sleep disorder, unspecified: Secondary | ICD-10-CM

## 2024-08-13 DIAGNOSIS — Z87898 Personal history of other specified conditions: Secondary | ICD-10-CM | POA: Diagnosis not present

## 2024-08-13 NOTE — Progress Notes (Signed)
 [facility]  PATIENT: Evelyn Lawson DOB: 08/03/1981  REQUESTING CLINICIAN: Vicci Rollo SAUNDERS, PA* HISTORY FROM: Patient and mother REASON FOR VISIT: Seizure versus syncope   HISTORICAL  CHIEF COMPLAINT:  Chief Complaint  Patient presents with   RM 13    Consult: ? Seizure like activity; with mom    HISTORY OF PRESENT ILLNESS:  Discussed the use of AI scribe software for clinical note transcription with the patient, who gave verbal consent to proceed.  Evelyn Lawson is a 43 year old female with history of hypertension, ADHD, anxiety depression who presents with episodes of syncope and possible seizure activity.   She experienced two significant fainting episodes this year, one in [place] and another in August. In [place], she felt like she was getting a cold and was diagnosed with a respiratory infection via [facility]. She fainted while using the restroom, hitting her head on the bathtub. She recalls feeling sweaty and having a tingly sensation in her head prior to fainting. A CT scan was performed, which showed no abnormalities.  In August, while on vacation, she fainted again after helping her mother down some steps. Her mother witnessed this episode and described her as clammy, sweaty, with eyes rolling back, and jerking movements. She also experienced urinary incontinence during this episode. Her mother noted that this was the first episode she had witnessed since she was a child.  She has a history of febrile seizures as a child and was on phenobarbital for three to four years. She has not had seizures since childhood until these recent episodes. She feels 'shaky inside' and has tried to cut back on sugars and monitor her Adderall intake to manage these sensations.  She is currently taking propranolol  since 2020 for high blood pressure and anxiety, and amlodipine , which was increased from 5 mg to 10 mg to better control her blood pressure. Her blood pressure readings  at home are generally stable, ranging from 108/70 to 119/70.  She reports that her hands and feet turn blue and cold, sometimes painfully, and she requires a heating pad for relief. This occurs randomly, even in warm weather.  Her family history includes seizures in her grandfather and sister, with her sister's seizures attributed to diabetes.  No tongue biting or unexplained falls from bed. She wakes up in the middle of the night and has experienced hallucinations with trazodone , which was discontinued.    Handedness: Right-handed  Onset: 05/04/2024  Seizure Type: Loss of consciousness fall  Current frequency: Reported to event that were concerning but she also reported history of syncope  Any injuries from seizures: Hitting head  Seizure risk factors: History of febrile seizure and epilepsy as a child, was on a phenobarbital  Previous ASMs: Phenobarbital  Currenty ASMs: None  ASMs side effects: Not applicable  Brain Images: Normal head CT  Previous EEGs: Not available for review   OTHER MEDICAL CONDITIONS: Syncope, anxiety, ADHD, hypertension  REVIEW OF SYSTEMS: Full 14 system review of systems performed and negative with exception of: As noted in the HPI  ALLERGIES: Allergies  Allergen Reactions   Tomato Swelling and Rash    Paste is fine     HOME MEDICATIONS: Outpatient Medications Prior to Visit  Medication Sig Dispense Refill   albuterol  (VENTOLIN  HFA) 108 (90 Base) MCG/ACT inhaler Inhale 2 puffs into the lungs every 6 (six) hours as needed for wheezing or shortness of breath.     amLODipine  (NORVASC ) 10 MG tablet TAKE 1 TABLET  BY MOUTH EVERY DAY 90 tablet 1   amphetamine-dextroamphetamine (ADDERALL XR) 25 MG 24 hr capsule Take 50 mg by mouth every morning.     amphetamine-dextroamphetamine (ADDERALL) 20 MG tablet Take 10-20 mg by mouth daily as needed (attention/focus (afternoons)).     cetirizine (ZYRTEC) 10 MG tablet Take 10 mg by mouth daily.     Multiple  Vitamin (MULTIVITAMIN WITH MINERALS) TABS tablet Take 1 tablet by mouth daily. Women's Multivitamin     propranolol  ER (INDERAL  LA) 160 MG SR capsule Take 1 capsule (160 mg total) by mouth daily. 90 capsule 1   sertraline  (ZOLOFT ) 50 MG tablet Take 1 tablet (50 mg total) by mouth daily. 90 tablet 1   No facility-administered medications prior to visit.    PAST MEDICAL HISTORY: Past Medical History:  Diagnosis Date   ADHD (attention deficit hyperactivity disorder)    Enlarged thyroid     ultasound in epic 08-07-2020  bilateral nodules   (08-16-2020 per pt pcp has referred her to an endocrinologist)   GERD (gastroesophageal reflux disease)    Heart murmur    Hemorrhoids    History of pneumonia    History of seizures as a child    last febrile seizure age 85   Hypertension    followed by pcp   Migraines    Numbness and tingling of both feet    Social anxiety disorder    Thyroid  nodule    Wears contact lenses     PAST SURGICAL HISTORY: Past Surgical History:  Procedure Laterality Date   BREAST ENHANCEMENT SURGERY  11/30/2008   CHOLECYSTECTOMY N/A 04/17/2023   Procedure: LAPAROSCOPIC CHOLECYSTECTOMY with ICG;  Surgeon: Tanda Locus, MD;  Location: [facility];  Service: General;  Laterality: N/A;   HEMORRHOID SURGERY  2013   LAPAROSCOPIC TUBAL LIGATION Bilateral 08/25/2020   Procedure: LAPAROSCOPIC TUBAL LIGATION WITH BIPOLAR CAUTERY;  Surgeon: Rutherford Gain, MD;  Location: [facility];  Service: Gynecology;  Laterality: Bilateral;   WISDOM TOOTH EXTRACTION      FAMILY HISTORY: Family History  Problem Relation Age of Onset   Cancer Maternal Grandmother        breast   Breast cancer Maternal Grandmother    Cancer Paternal Grandmother        breast   Breast cancer Paternal Grandmother    Cancer Paternal Grandfather        colon   Diabetes Maternal Grandfather    Breast cancer Maternal Aunt    Thyroid  disease Neg Hx     SOCIAL HISTORY: Social History    Socioeconomic History   Marital status: Single    Spouse name: Not on file   Number of children: 1   Years of education: Not on file   Highest education level: Bachelor's degree (e.g., BA, AB, BS)  Occupational History   Occupation: HR specialist  Tobacco Use   Smoking status: Never   Smokeless tobacco: Never  Vaping Use   Vaping status: Never Used  Substance and Sexual Activity   Alcohol use: Yes    Comment: social   Drug use: Never   Sexual activity: Not on file  Other Topics Concern   Not on file  Social History Narrative   ** Merged History Encounter **       Social Drivers of Health   Financial Resource Strain: Low Risk  (04/16/2024)   Overall Financial Resource Strain (CARDIA)    Difficulty of Paying Living Expenses: Not hard at all  Food Insecurity: No  Food Insecurity (04/16/2024)   Hunger Vital Sign    Worried About Running Out of Food in the Last Year: Never true    Ran Out of Food in the Last Year: Never true  Transportation Needs: No Transportation Needs (04/16/2024)   PRAPARE - Administrator, Civil Service (Medical): No    Lack of Transportation (Non-Medical): No  Physical Activity: Insufficiently Active (04/16/2024)   Exercise Vital Sign    Days of Exercise per Week: 3 days    Minutes of Exercise per Session: 30 min  Stress: Stress Concern Present (04/16/2024)   [employer] of Occupational Health - Occupational Stress Questionnaire    Feeling of Stress: To some extent  Social Connections: Socially Isolated (04/16/2024)   Social Connection and Isolation Panel    Frequency of Communication with Friends and Family: More than three times a week    Frequency of Social Gatherings with Friends and Family: More than three times a week    Attends Religious Services: Never    Database Administrator or Organizations: No    Attends Engineer, Structural: Not on file    Marital Status: Never married  Intimate Partner Violence: Not At Risk  (10/17/2023)   Humiliation, Afraid, Rape, and Kick questionnaire    Fear of Current or Ex-Partner: No    Emotionally Abused: No    Physically Abused: No    Sexually Abused: No    PHYSICAL EXAM  GENERAL EXAM/CONSTITUTIONAL: Vitals:  Vitals:   08/13/24 0842  BP: 114/76  Pulse: 71  Weight: 176 lb (79.8 kg)  Height: 5' 6 (1.676 m)   Body mass index is 28.41 kg/m. Wt Readings from Last 3 Encounters:  08/13/24 176 lb (79.8 kg)  08/02/24 178 lb 9.6 oz (81 kg)  05/11/24 177 lb 9.6 oz (80.6 kg)   Patient is in no distress; well developed, nourished and groomed; neck is supple  MUSCULOSKELETAL: Gait, strength, tone, movements noted in Neurologic exam below  NEUROLOGIC: MENTAL STATUS:      No data to display         awake, alert, oriented to person, place and time recent and remote memory intact normal attention and concentration language fluent, comprehension intact, naming intact fund of knowledge appropriate  CRANIAL NERVE:  2nd, 3rd, 4th, 6th - Visual fields full to confrontation, extraocular muscles intact, no nystagmus 5th - facial sensation symmetric 7th - facial strength symmetric 8th - hearing intact 9th - palate elevates symmetrically, uvula midline 11th - shoulder shrug symmetric 12th - tongue protrusion midline  MOTOR:  normal bulk and tone, full strength in the BUE, BLE  SENSORY:  normal and symmetric to light touch  COORDINATION:  finger-nose-finger, fine finger movements normal  GAIT/STATION:  normal   DIAGNOSTIC DATA (LABS, IMAGING, TESTING) - I reviewed patient records, labs, notes, testing and imaging myself where available.  Lab Results  Component Value Date   WBC 6.6 01/08/2024   HGB 13.3 01/08/2024   HCT 40.9 01/08/2024   MCV 96 01/08/2024   PLT 358 01/08/2024      Component Value Date/Time   NA 140 01/08/2024 1128   K 4.1 01/08/2024 1128   CL 99 01/08/2024 1128   CO2 26 01/08/2024 1128   GLUCOSE 83 01/08/2024 1128    GLUCOSE 89 04/17/2023 1325   BUN 9 01/08/2024 1128   CREATININE 0.86 01/08/2024 1128   CALCIUM 9.9 01/08/2024 1128   PROT 7.4 01/08/2024 1128   ALBUMIN 4.7 01/08/2024 1128  AST 17 01/08/2024 1128   ALT 10 01/08/2024 1128   ALKPHOS 78 01/08/2024 1128   BILITOT 0.3 01/08/2024 1128   GFRNONAA >60 04/17/2023 1325   GFRAA 87 08/01/2020 1021   Lab Results  Component Value Date   CHOL 227 (H) 08/05/2022   HDL 96 08/05/2022   LDLCALC 119 (H) 08/05/2022   TRIG 72 08/05/2022   No results found for: HGBA1C No results found for: VITAMINB12 Lab Results  Component Value Date   TSH 2.060 03/02/2021    Head CT 01/08/2024 No acute intracranial process.    ASSESSMENT AND PLAN  43 y.o. year old female  with hypertension, anxiety/depression, ADHD who presents with syncope versus seizures.   Recurrent syncope versus seizure disorder Recurrent episodes of syncope with differential diagnosis including seizure disorder. Episodes characterized by sweating, clamminess, and loss of consciousness. Recent episodes in [place] and August with associated symptoms such as urinary incontinence and drooling, which are atypical for syncope. Family history of seizures. Previous CT scan normal. Differential includes vasovagal syncope vs seizure disorder. Driving restrictions apply if seizures are confirmed. - Ordered EEG in office for 30 minutes. - If EEG is normal, will order home EEG for 3 days. - If EEGs are normal, will continue to monitor blood pressure and fluid intake. - If EEGs show abnormalities, will consider initiating anti-seizure medication and will order MRI. - Advised on increasing fluid intake to 64 ounces daily. - Discussed driving restrictions and potential to resume driving if syncope is confirmed.  Raynaud phenomenon Intermittent episodes of cold, tingling, and bluish discoloration of fingers and toes, occurring randomly and not solely in cold environments. Symptoms are painful but  manageable with heating pads.  - Discuss with primary care provider before reducing amlodipine  since it is one of the treatment for Raynaud phenomenon management.  Hypertension Managed with propranolol  and amlodipine . Blood pressure readings at home are generally well-controlled, with occasional low readings. Cardiologist suggested reducing amlodipine  dosage due to lifestyle changes. - Continue current antihypertensive regimen. - Monitor blood pressure regularly at home.  Insomnia Difficulty maintaining sleep, with frequent awakenings around 2-3 AM. Previous use of trazodone  resulted in hallucinations. Currently taking sertraline  at night, which does not aid sleep. - Recommended melatonin 3 mg, taken 3 hours before bedtime. - Will consider alternative medications for sleep if melatonin is ineffective.      1. Syncope, unspecified syncope type   2. Raynaud's phenomenon without gangrene   3. History of seizure   4. Sleep difficulties     Patient Instructions  Routine EEG, if normal will obtain ambulatory EEG If abnormal, we will likely initiate antiseizure medication and obtain a MRI brain Continue your other medications Call for any additional concerns Return as needed   Per [place] statutes, patients with seizures are not allowed to drive until they have been seizure-free for six months.  Other recommendations include using caution when using heavy equipment or power tools. Avoid working on ladders or at heights. Take showers instead of baths.  Do not swim alone.  Ensure the water temperature is not too high on the home water heater. Do not go swimming alone. Do not lock yourself in a room alone (i.e. bathroom). When caring for infants or small children, sit down when holding, feeding, or changing them to minimize risk of injury to the child in the event you have a seizure. Maintain good sleep hygiene. Avoid alcohol.  Also recommend adequate sleep, hydration, good diet and  minimize stress.  During the Seizure  - First, ensure adequate ventilation and place patients on the floor on their left side  Loosen clothing around the neck and ensure the airway is patent. If the patient is clenching the teeth, do not force the mouth open with any object as this can cause severe damage - Remove all items from the surrounding that can be hazardous. The patient may be oblivious to what's happening and may not even know what he or she is doing. If the patient is confused and wandering, either gently guide him/her away and block access to outside areas - Reassure the individual and be comforting - Call 911. In most cases, the seizure ends before EMS arrives. However, there are cases when seizures may last over 3 to 5 minutes. Or the individual may have developed breathing difficulties or severe injuries. If a pregnant patient or a person with diabetes develops a seizure, it is prudent to call an ambulance. - Finally, if the patient does not regain full consciousness, then call EMS. Most patients will remain confused for about 45 to 90 minutes after a seizure, so you must use judgment in calling for help. - Avoid restraints but make sure the patient is in a bed with padded side rails - Place the individual in a lateral position with the neck slightly flexed; this will help the saliva drain from the mouth and prevent the tongue from falling backward - Remove all nearby furniture and other hazards from the area - Provide verbal assurance as the individual is regaining consciousness - Provide the patient with privacy if possible - Call for help and start treatment as ordered by the caregiver   After the Seizure (Postictal Stage)  After a seizure, most patients experience confusion, fatigue, muscle pain and/or a headache. Thus, one should permit the individual to sleep. For the next few days, reassurance is essential. Being calm and helping reorient the person is also of  importance.  Most seizures are painless and end spontaneously. Seizures are not harmful to others but can lead to complications such as stress on the lungs, brain and the heart. Individuals with prior lung problems may develop labored breathing and respiratory distress.    Discussed Patients with epilepsy have a small risk of sudden unexpected death, a condition referred to as sudden unexpected death in epilepsy (SUDEP). SUDEP is defined specifically as the sudden, unexpected, witnessed or unwitnessed, nontraumatic and nondrowning death in patients with epilepsy with or without evidence for a seizure, and excluding documented status epilepticus, in which post mortem examination does not reveal a structural or toxicologic cause for death     Orders Placed This Encounter  Procedures   EEG adult    No orders of the defined types were placed in this encounter.   Return if symptoms worsen or fail to improve.    Pastor Falling, MD 08/13/2024, 9:29 AM  [facility] [address], [address] [place], [place] [address] [phone]

## 2024-08-13 NOTE — Patient Instructions (Signed)
 Routine EEG, if normal will obtain ambulatory EEG If abnormal, we will likely initiate antiseizure medication and obtain a MRI brain Continue your other medications Call for any additional concerns Return as needed

## 2024-08-24 ENCOUNTER — Encounter: Payer: Self-pay | Admitting: Neurology

## 2024-08-24 ENCOUNTER — Other Ambulatory Visit: Admitting: *Deleted

## 2024-08-24 NOTE — Telephone Encounter (Signed)
 Called pt and advised it would be best to reschedule until she is feeling better. Rescheduled to 09/06/24 at 3:15pm.   Cx appt for today.

## 2024-08-27 ENCOUNTER — Ambulatory Visit: Payer: Self-pay | Admitting: Internal Medicine

## 2024-09-01 ENCOUNTER — Telehealth: Payer: Self-pay | Admitting: Neurology

## 2024-09-01 NOTE — Telephone Encounter (Signed)
 Appt Rescheduled.

## 2024-09-01 NOTE — Telephone Encounter (Signed)
 LVM and sent mychart msg informing pt of need to reschedule 09/06/24 EEG - [facility] out

## 2024-09-06 ENCOUNTER — Other Ambulatory Visit: Admitting: *Deleted

## 2024-09-13 ENCOUNTER — Ambulatory Visit: Admitting: Neurology

## 2024-09-13 ENCOUNTER — Ambulatory Visit: Payer: Self-pay | Admitting: Neurology

## 2024-09-13 DIAGNOSIS — Z87898 Personal history of other specified conditions: Secondary | ICD-10-CM

## 2024-09-13 DIAGNOSIS — R55 Syncope and collapse: Secondary | ICD-10-CM

## 2024-09-13 NOTE — Procedures (Signed)
" ° ° °  History:  43 year old woman with seizure vs. Syncope  EEG classification: Awake and drowsy  Duration: 30 minutes   Technical aspects: This EEG study was done with scalp electrodes positioned according to the 10-20 International system of electrode placement. Electrical activity was reviewed with band pass filter of 1-70Hz , sensitivity of 7 uV/mm, display speed of 63mm/sec with a 60Hz  notched filter applied as appropriate. EEG data were recorded continuously and digitally stored.   Description of the recording: The background rhythms of this recording consists of a fairly well modulated medium amplitude alpha rhythm of 11 Hz that is reactive to eye opening and closure. Present in the anterior head region is a 15-20 Hz beta activity. Photic stimulation was performed, did not show any abnormalities. Hyperventilation was also performed, did not show any abnormalities. Drowsiness was manifested by background fragmentation. No abnormal epileptiform discharges seen during this recording. There was no focal slowing. There were no electrographic seizure identified.   Abnormality: None   Impression: This is a normal awake and drowsy EEG. No evidence of interictal epileptiform discharges. Normal EEGs, however, do not rule out epilepsy.    Pastor Falling, MD [facility]  "

## 2024-10-21 ENCOUNTER — Ambulatory Visit: Admitting: Internal Medicine

## 2024-10-27 ENCOUNTER — Encounter: Payer: Self-pay | Admitting: Internal Medicine

## 2024-10-28 ENCOUNTER — Other Ambulatory Visit: Payer: Self-pay | Admitting: Internal Medicine

## 2024-10-28 DIAGNOSIS — N943 Premenstrual tension syndrome: Secondary | ICD-10-CM

## 2024-10-29 ENCOUNTER — Encounter: Payer: Self-pay | Admitting: Neurology

## 2024-11-23 ENCOUNTER — Ambulatory Visit: Admitting: Internal Medicine
